# Patient Record
Sex: Female | Born: 1965 | Race: Asian | Hispanic: No | Marital: Married | Smoking: Never smoker
Health system: Southern US, Community
[De-identification: ages and names within clinical notes are randomized; demographics above are authoritative.]

## PROBLEM LIST (undated history)

## (undated) DIAGNOSIS — C50919 Malignant neoplasm of unspecified site of unspecified female breast: Secondary | ICD-10-CM

## (undated) DIAGNOSIS — I341 Nonrheumatic mitral (valve) prolapse: Secondary | ICD-10-CM

## (undated) DIAGNOSIS — O99810 Abnormal glucose complicating pregnancy: Secondary | ICD-10-CM

## (undated) DIAGNOSIS — T783XXA Angioneurotic edema, initial encounter: Secondary | ICD-10-CM

## (undated) DIAGNOSIS — R002 Palpitations: Secondary | ICD-10-CM

## (undated) DIAGNOSIS — T7840XA Allergy, unspecified, initial encounter: Secondary | ICD-10-CM

## (undated) DIAGNOSIS — E079 Disorder of thyroid, unspecified: Secondary | ICD-10-CM

## (undated) DIAGNOSIS — M419 Scoliosis, unspecified: Secondary | ICD-10-CM

## (undated) DIAGNOSIS — O24419 Gestational diabetes mellitus in pregnancy, unspecified control: Secondary | ICD-10-CM

## (undated) HISTORY — DX: Angioneurotic edema, initial encounter: T78.3XXA

## (undated) HISTORY — DX: Disorder of thyroid, unspecified: E07.9

## (undated) HISTORY — DX: Scoliosis, unspecified: M41.9

## (undated) HISTORY — DX: Abnormal glucose complicating pregnancy: O99.810

## (undated) HISTORY — DX: Palpitations: R00.2

## (undated) HISTORY — PX: BREAST LUMPECTOMY: SHX2

## (undated) HISTORY — DX: Malignant neoplasm of unspecified site of unspecified female breast: C50.919

## (undated) HISTORY — PX: TOTAL ABDOMINAL HYSTERECTOMY W/ BILATERAL SALPINGOOPHORECTOMY: SHX83

## (undated) HISTORY — DX: Allergy, unspecified, initial encounter: T78.40XA

## (undated) HISTORY — DX: Nonrheumatic mitral (valve) prolapse: I34.1

## (undated) HISTORY — PX: APPENDECTOMY: SHX54

## (undated) HISTORY — DX: Gestational diabetes mellitus in pregnancy, unspecified control: O24.419

---

## 2003-04-09 DIAGNOSIS — C50919 Malignant neoplasm of unspecified site of unspecified female breast: Secondary | ICD-10-CM

## 2003-04-09 HISTORY — DX: Malignant neoplasm of unspecified site of unspecified female breast: C50.919

## 2006-07-28 ENCOUNTER — Ambulatory Visit: Payer: Self-pay | Admitting: Internal Medicine

## 2006-08-05 ENCOUNTER — Ambulatory Visit: Payer: Self-pay | Admitting: Internal Medicine

## 2006-08-05 LAB — CONVERTED CEMR LAB
ALT: 14 units/L (ref 0–40)
Alkaline Phosphatase: 52 units/L (ref 39–117)
BUN: 6 mg/dL (ref 6–23)
Basophils Relative: 0.5 % (ref 0.0–1.0)
Bilirubin, Direct: 0.1 mg/dL (ref 0.0–0.3)
CO2: 28 meq/L (ref 19–32)
Calcium: 8.8 mg/dL (ref 8.4–10.5)
Cholesterol: 222 mg/dL (ref 0–200)
Creatinine, Ser: 0.6 mg/dL (ref 0.4–1.2)
Eosinophils Relative: 1.3 % (ref 0.0–5.0)
Glucose, Bld: 97 mg/dL (ref 70–99)
Hemoglobin: 11.5 g/dL — ABNORMAL LOW (ref 12.0–15.0)
Lymphocytes Relative: 27.1 % (ref 12.0–46.0)
Monocytes Absolute: 0.3 10*3/uL (ref 0.2–0.7)
Monocytes Relative: 6.6 % (ref 3.0–11.0)
Platelets: 294 10*3/uL (ref 150–400)
RDW: 13.8 % (ref 11.5–14.6)
Total Bilirubin: 0.7 mg/dL (ref 0.3–1.2)
Total CHOL/HDL Ratio: 4.2
Total Protein: 7.8 g/dL (ref 6.0–8.3)
VLDL: 19 mg/dL (ref 0–40)
WBC: 4.8 10*3/uL (ref 4.5–10.5)

## 2006-09-19 ENCOUNTER — Encounter: Payer: Self-pay | Admitting: Internal Medicine

## 2006-10-08 ENCOUNTER — Ambulatory Visit: Payer: Self-pay | Admitting: Internal Medicine

## 2006-11-03 ENCOUNTER — Encounter: Payer: Self-pay | Admitting: Internal Medicine

## 2006-11-03 ENCOUNTER — Other Ambulatory Visit: Admission: RE | Admit: 2006-11-03 | Discharge: 2006-11-03 | Payer: Self-pay | Admitting: Obstetrics and Gynecology

## 2006-11-12 ENCOUNTER — Encounter: Admission: RE | Admit: 2006-11-12 | Discharge: 2006-11-12 | Payer: Self-pay | Admitting: Obstetrics and Gynecology

## 2006-12-04 DIAGNOSIS — Z853 Personal history of malignant neoplasm of breast: Secondary | ICD-10-CM | POA: Insufficient documentation

## 2007-01-23 ENCOUNTER — Encounter: Payer: Self-pay | Admitting: Internal Medicine

## 2007-02-09 ENCOUNTER — Ambulatory Visit: Payer: Self-pay | Admitting: Internal Medicine

## 2007-02-09 DIAGNOSIS — Z8679 Personal history of other diseases of the circulatory system: Secondary | ICD-10-CM | POA: Insufficient documentation

## 2007-02-09 DIAGNOSIS — O99810 Abnormal glucose complicating pregnancy: Secondary | ICD-10-CM

## 2007-02-09 DIAGNOSIS — R7301 Impaired fasting glucose: Secondary | ICD-10-CM | POA: Insufficient documentation

## 2007-02-09 DIAGNOSIS — J029 Acute pharyngitis, unspecified: Secondary | ICD-10-CM | POA: Insufficient documentation

## 2007-02-09 DIAGNOSIS — D649 Anemia, unspecified: Secondary | ICD-10-CM | POA: Insufficient documentation

## 2007-02-09 HISTORY — DX: Abnormal glucose complicating pregnancy: O99.810

## 2007-02-11 ENCOUNTER — Encounter: Payer: Self-pay | Admitting: Internal Medicine

## 2007-05-01 ENCOUNTER — Ambulatory Visit: Payer: Self-pay | Admitting: Internal Medicine

## 2007-05-01 LAB — CONVERTED CEMR LAB
Bilirubin Urine: NEGATIVE
Ketones, urine, test strip: NEGATIVE
Protein, U semiquant: NEGATIVE
Urobilinogen, UA: 0.2
Vit D, 1,25-Dihydroxy: 56 (ref 30–89)

## 2007-05-03 LAB — CONVERTED CEMR LAB
ALT: 17 units/L (ref 0–35)
Alkaline Phosphatase: 43 units/L (ref 39–117)
BUN: 10 mg/dL (ref 6–23)
Basophils Relative: 0.3 % (ref 0.0–1.0)
CO2: 30 meq/L (ref 19–32)
Calcium: 9.6 mg/dL (ref 8.4–10.5)
Chloride: 104 meq/L (ref 96–112)
Cholesterol: 217 mg/dL (ref 0–200)
GFR calc Af Amer: 118 mL/min
HDL: 49.2 mg/dL (ref >39.0)
Lymphocytes Relative: 32.4 % (ref 12.0–46.0)
Monocytes Relative: 5.5 % (ref 3.0–11.0)
Neutro Abs: 2.6 10*3/uL (ref 1.4–7.7)
Platelets: 266 10*3/uL (ref 150–400)
RBC: 4.07 M/uL (ref 3.87–5.11)
RDW: 12 % (ref 11.5–14.6)
TSH: 1.04 microintl units/mL (ref 0.35–5.50)
Total CHOL/HDL Ratio: 4.4
Total Protein: 7.6 g/dL (ref 6.0–8.3)
Triglycerides: 100 mg/dL (ref 0–149)
VLDL: 20 mg/dL (ref 0–40)

## 2007-05-08 ENCOUNTER — Ambulatory Visit: Payer: Self-pay | Admitting: Internal Medicine

## 2007-05-08 LAB — CONVERTED CEMR LAB
Bilirubin Urine: NEGATIVE
Ketones, urine, test strip: NEGATIVE
Specific Gravity, Urine: 1.02

## 2007-05-15 ENCOUNTER — Encounter: Admission: RE | Admit: 2007-05-15 | Discharge: 2007-05-15 | Payer: Self-pay | Admitting: Endocrinology

## 2007-05-20 ENCOUNTER — Ambulatory Visit: Payer: Self-pay | Admitting: Internal Medicine

## 2007-06-17 ENCOUNTER — Ambulatory Visit: Payer: Self-pay | Admitting: Internal Medicine

## 2007-09-22 ENCOUNTER — Encounter: Payer: Self-pay | Admitting: Internal Medicine

## 2007-10-10 LAB — CONVERTED CEMR LAB: Pap Smear: NORMAL

## 2007-11-03 ENCOUNTER — Other Ambulatory Visit: Admission: RE | Admit: 2007-11-03 | Discharge: 2007-11-03 | Payer: Self-pay | Admitting: Obstetrics and Gynecology

## 2007-11-17 ENCOUNTER — Ambulatory Visit: Payer: Self-pay | Admitting: Internal Medicine

## 2008-08-29 ENCOUNTER — Ambulatory Visit: Payer: Self-pay | Admitting: Internal Medicine

## 2008-08-29 LAB — CONVERTED CEMR LAB
BUN: 10 mg/dL (ref 6–23)
Basophils Relative: 0.3 % (ref 0.0–3.0)
Bilirubin Urine: NEGATIVE
Bilirubin, Direct: 0 mg/dL (ref 0.0–0.3)
CO2: 27 meq/L (ref 19–32)
Chloride: 108 meq/L (ref 96–112)
Cholesterol: 205 mg/dL — ABNORMAL HIGH (ref 0–200)
Creatinine, Ser: 0.6 mg/dL (ref 0.4–1.2)
Direct LDL: 137.5 mg/dL
Eosinophils Absolute: 0.2 10*3/uL (ref 0.0–0.7)
Glucose, Urine, Semiquant: NEGATIVE
Lymphs Abs: 1.2 10*3/uL (ref 0.7–4.0)
MCHC: 34.7 g/dL (ref 30.0–36.0)
MCV: 94.5 fL (ref 78.0–100.0)
Monocytes Absolute: 0.3 10*3/uL (ref 0.1–1.0)
Neutrophils Relative %: 66.5 % (ref 43.0–77.0)
Platelets: 249 10*3/uL (ref 150.0–400.0)
RBC: 3.72 M/uL — ABNORMAL LOW (ref 3.87–5.11)
TSH: 0.56 microintl units/mL (ref 0.35–5.50)
Total Protein: 7.9 g/dL (ref 6.0–8.3)
pH: 7

## 2008-09-06 ENCOUNTER — Ambulatory Visit: Payer: Self-pay | Admitting: Internal Medicine

## 2008-11-17 ENCOUNTER — Other Ambulatory Visit: Admission: RE | Admit: 2008-11-17 | Discharge: 2008-11-17 | Payer: Self-pay | Admitting: Obstetrics and Gynecology

## 2008-12-30 ENCOUNTER — Ambulatory Visit: Payer: Self-pay | Admitting: Internal Medicine

## 2009-01-05 LAB — CONVERTED CEMR LAB
Basophils Absolute: 0 10*3/uL (ref 0.0–0.1)
Eosinophils Absolute: 0.3 10*3/uL (ref 0.0–0.7)
Ferritin: 12.4 ng/mL (ref 10.0–291.0)
Free T4: 1 ng/dL (ref 0.6–1.6)
Hemoglobin: 12.5 g/dL (ref 12.0–15.0)
Iron: 44 ug/dL (ref 42–145)
Lymphocytes Relative: 27.6 % (ref 12.0–46.0)
Monocytes Relative: 7.7 % (ref 3.0–12.0)
Neutro Abs: 3.4 10*3/uL (ref 1.4–7.7)
Neutrophils Relative %: 58.9 % (ref 43.0–77.0)
Platelets: 303 10*3/uL (ref 150.0–400.0)
RDW: 13.5 % (ref 11.5–14.6)
TSH: 0.71 microintl units/mL (ref 0.35–5.50)

## 2009-01-09 ENCOUNTER — Ambulatory Visit: Payer: Self-pay | Admitting: Internal Medicine

## 2009-01-09 DIAGNOSIS — D509 Iron deficiency anemia, unspecified: Secondary | ICD-10-CM | POA: Insufficient documentation

## 2009-10-17 ENCOUNTER — Encounter: Payer: Self-pay | Admitting: Internal Medicine

## 2010-01-02 ENCOUNTER — Ambulatory Visit: Payer: Self-pay | Admitting: Internal Medicine

## 2010-01-02 LAB — CONVERTED CEMR LAB
ALT: 18 units/L (ref 0–35)
AST: 22 units/L (ref 0–37)
BUN: 15 mg/dL (ref 6–23)
Basophils Relative: 0.4 % (ref 0.0–3.0)
Bilirubin, Direct: 0.1 mg/dL (ref 0.0–0.3)
Chloride: 105 meq/L (ref 96–112)
Creatinine, Ser: 0.6 mg/dL (ref 0.4–1.2)
Direct LDL: 170.1 mg/dL
Eosinophils Relative: 2.8 % (ref 0.0–5.0)
GFR calc non Af Amer: 106.74 mL/min (ref >60)
HCT: 37.8 % (ref 36.0–46.0)
HDL: 56.6 mg/dL (ref >39.00)
MCV: 95 fL (ref 78.0–100.0)
Monocytes Absolute: 0.4 10*3/uL (ref 0.1–1.0)
Monocytes Relative: 8 % (ref 3.0–12.0)
Neutrophils Relative %: 66.7 % (ref 43.0–77.0)
Platelets: 258 10*3/uL (ref 150.0–400.0)
Potassium: 3.9 meq/L (ref 3.5–5.1)
Protein, U semiquant: NEGATIVE
RBC: 3.98 M/uL (ref 3.87–5.11)
Total Bilirubin: 0.2 mg/dL — ABNORMAL LOW (ref 0.3–1.2)
Total CHOL/HDL Ratio: 4
Total Protein: 8 g/dL (ref 6.0–8.3)
Urobilinogen, UA: 0.2
VLDL: 26.2 mg/dL (ref 0.0–40.0)
WBC Urine, dipstick: NEGATIVE
WBC: 5.2 10*3/uL (ref 4.5–10.5)

## 2010-01-09 ENCOUNTER — Ambulatory Visit: Payer: Self-pay | Admitting: Internal Medicine

## 2010-01-09 DIAGNOSIS — M4120 Other idiopathic scoliosis, site unspecified: Secondary | ICD-10-CM | POA: Insufficient documentation

## 2010-01-09 DIAGNOSIS — E785 Hyperlipidemia, unspecified: Secondary | ICD-10-CM | POA: Insufficient documentation

## 2010-05-08 NOTE — Letter (Signed)
Summary: [facility]  [facility]   Imported By: Maryln Gottron 11/10/2009 11:20:33  _____________________________________________________________________  External Attachment:    Type:   Image     Comment:   External Document

## 2010-05-08 NOTE — Assessment & Plan Note (Signed)
Summary: cpx---pap//ccm   Vital Signs:  Patient profile:   45 year old female Menstrual status:  regular LMP:     12/17/2009 Height:      60.25 inches Weight:      121 pounds BMI:     23.52 Pulse rate:   72 / minute BP sitting:   100 / 60  (right arm) Cuff size:   regular  Vitals Entered By: Romualdo Bolk, CMA (AAMA) (January 09, 2010 10:10 AM) CC: CPX without pap. Pt has a gyn who does paps. LMP (date): 12/17/2009 LMP - Character: normal Menarche (age onset years): 10   Menses interval (days): 26-28 Menstrual flow (days): 5-7 Enter LMP: 12/17/2009 Last PAP Result normal   History of Present Illness: Melissa Brown comes in today  for preventive visit . Since last visit  here  there have been no major changes in health status  . Seeing  new gyne .  this week  CV:  taking  atenolol once daily  .tachy . sees Dr Mayford Knife. taking iron with menses   Preventive Care Screening  Mammogram:    Date:  10/22/2009    Results:  normal   Pap Smear:    Date:  10/06/2008    Results:  normal   Prior Values:    Pap Smear:  normal (10/10/2007)    Mammogram:  normal (09/07/2007)    Last Tetanus Booster:  Tdap (07/08/2006)   Preventive Screening-Counseling & Management  Alcohol-Tobacco     Alcohol drinks/day: 0     Smoking Status: never  Caffeine-Diet-Exercise     Caffeine use/day: 0     Does Patient Exercise: yes     Type of exercise: aerobics  Hep-HIV-STD-Contraception     Dental Visit-last 6 months no     Sun Exposure-Excessive: no  Safety-Violence-Falls     Seat Belt Use: yes     Firearms in the Home: no firearms in the home     Smoke Detectors: yes      Blood Transfusions:  no.    Current Medications (verified): 1)  Atenolol 25 Mg  Tabs (Atenolol) .... Take 1 Tablet By Mouth Once A Day 2)  One-A-Day Womens Formula   Tabs (Multiple Vitamins-Calcium) 3)  Lidex 0.05 %  Crea (Fluocinonide) .... As Needed 4)  Ferrous Sulfate 325 (65 Fe) Mg Tabs (Ferrous Sulfate)  .... Takes During Cycle.  Allergies (verified): No Known Drug Allergies  Past History:  Past medical, surgical, family and social histories (including risk factors) reviewed, and no changes noted (except as noted below).  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 breast cancer, radiation DCIS--reconstruction left Caesarean section  Past History:  Care Management: Gynecology: Thomasena Edis Hematology/Oncology: Melins Opthalmologist  Cardiology: Mayford Knife    Dr Talmage Nap in the past   Family History: Reviewed history from 09/06/2008 and no changes required. Family History Diabetes 1st degree relative  mother, father CAD stent father Family History Thyroid disease mom  Social History: Reviewed history from 09/06/2008 and no changes required. 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  to 9  To do online degree accounting.    Blood Transfusions:  no  Review of Systems       neg cv pulm eye  change ( glasses)  ortho  bleeding  GI  has bump stype left eye   Physical Exam  General:  Well-developed,well-nourished,in no acute distress; alert,appropriate and cooperative throughout examination Eyes:  PERRL, EOMs full, conjunctiva clear  left lower lid with pinpoint stye no redness or dc  Ears:  R ear normal, L ear normal, and no external deformities.   Nose:  no external deformity, no external erythema, and no nasal discharge.   Mouth:  pharynx pink and moist.   Neck:  palpable thyroid no nodules  Chest Wall:  left breast reconstr no masses  Breasts:  left breast reconstructed  no masses  Lungs:  Normal respiratory effort, chest expands symmetrically. Lungs are clear to auscultation, no crackles or wheezes. Heart:  Normal rate and  regular rhythm. S1 and S2 normal without gallop, murmur, click, rub or other extra sounds.no lifts.   Abdomen:  Bowel sounds positive,abdomen soft and non-tender without masses, organomegaly or hernias noted. Msk:  no joint swelling and no joint warmth.  scoliosis left hump.  Pulses:  pulses intact without delay   Extremities:  no clubbing cyanosis or edema  Neurologic:  alert & oriented X3, cranial nerves II-XII intact, sensation intact to light touch, gait normal, and DTRs symmetrical and normal.   Skin:  turgor normal, color normal, no ecchymoses, no petechiae, and no purpura.   Cervical Nodes:  No lymphadenopathy noted Axillary Nodes:  No palpable lymphadenopathy Inguinal Nodes:  No significant adenopathy Psych:  Normal eye contact, appropriate affect. Cognition appears normal.    Impression & Recommendations:  Problem # 1:  Preventive Health Care (ICD-V70.0) Discussed nutrition,exercise,diet,healthy weight, vitamin D and calcium.   Problem # 2:  IRON DEFICIENCY (ICD-280.9) no longer anemia       iron studies not done by lab as requested  Her updated medication list for this problem includes:    Ferrous Sulfate 325 (65 Fe) Mg Tabs (Ferrous sulfate) .Marland Kitchen... Takes during cycle.  Problem # 3:  HYPERLIPIDEMIA (ICD-272.4) Assessment: Deteriorated  reviewed and counseled   Labs Reviewed: SGOT: 22 (01/02/2010)   SGPT: 18 (01/02/2010)   HDL:56.60 (01/02/2010), 53.40 (08/29/2008)  LDL:DEL (05/01/2007), DEL (08/05/2006)  Chol:252 (01/02/2010), 205 (08/29/2008)  Trig:131.0 (01/02/2010), 73.0 (08/29/2008)  Problem # 4:  BREAST CANCER, HX OF (ICD-V10.3) vit d level not done   as requested   disc doing at  next blood draw.   Complete Medication List: 1)  Atenolol 25 Mg Tabs (Atenolol) .... Take 1 tablet by mouth once a day 2)  One-a-day Womens Formula Tabs (Multiple vitamins-calcium) 3)  Lidex 0.05 % Crea (Fluocinonide) .... As needed 4)  Ferrous Sulfate 325 (65 Fe) Mg Tabs (Ferrous  sulfate) .... Takes during cycle.  Patient Instructions: 1)  rec take [phone] International Units of vitamin D  per day 2)  (Check your vitamin bottle) 3)  Ok to continue  iron  during period times . 4)  Increase exercise   reviewed diet  because of your increased cholesterol. 5)  Next blood check  yearly  check up  add vitamin D level .

## 2010-08-24 NOTE — Assessment & Plan Note (Signed)
[facility]                            [facility] OFFICE NOTE   NAME:Melissa Brown, Melissa Brown                          MRN:          [id number]  DATE:07/28/2006                            DOB:          Sep 12, 1965    NEW PATIENT EVALUATION:   CHIEF COMPLAINT:  New patient to get established.   HISTORY OF PRESENT ILLNESS:  Ms. Meuser is a 45 year old nonsmoking  Chinese-American married female who comes in today for first-time visit.  She is establishing in the area.  Needs a primary care physician.  She  carries diagnoses of past history of breast cancer, right DCIS, status  post mastectomy and reconstruction in 2005.  Has chosen not to take  tamoxifen suppression.  She also has a diagnosis of mitral valve  prolapse for which she is on atenolol for tachycardia, is seen by Dr.  Armanda Magic, cardiologist.  Her oncologist locally is Dr. Lennie Odor.   Also of concern is her past history of gestational diabetes and slightly  elevated fasting blood sugar with a very strong family history of type 2  diabetes.  Her last blood work was done about a year ago, and she is due  for this also.   PAST MEDICAL HISTORY:  See database.  She is gravida 2, para 1.  Last  Pap July, 2006.  Last mammogram January, 2008.   SURGERIES:  A C-section in 1977.  Breast biopsy in 2005 and 2006.  Mastectomy, DCIS in 2005 with breast reconstruction.   Echocardiogram two years ago.  History of abnormal thyroid in the past.  She has had some borderline elevated lipids with generally apparently a  normal ratio.   MEDICATIONS:  Atenolol 25 mg a day.   DRUG ALLERGIES:  None recorded.   FAMILY HISTORY:  Father with coronary artery disease.  He had a stent.  Both parents have type 2 diabetes, ages 27 and 46, mom with thyroid  disease.   SOCIAL HISTORY:  Household of three.  Is an Airline pilot.  Negative TAD.  Tries to do regular exercise 3-4 times a week, aerobics, about a half  hour.   REVIEW OF SYSTEMS:  Negative for chest pain, shortness of breath.  Significant GI/GU problems except as per HPI.   OBJECTIVE:  VITAL SIGNS:  Height 5 feet.  Weight 112.  Pulse 78 and  regular.  Blood pressure 110/70.  GENERAL:  This is a WD/WN, healthy-appearing lady in no acute distress.  HEENT:  Grossly normal.  NECK:  Supple without mass, thyromegaly, or bruit.  CHEST:  CTA.  BS equal.  CARDIAC:  S1 and S2.  No gallops.  A 1/6 systolic ejection murmur.  Diastole is clear.  Peripheral pulses are present without delay.  BREASTS:  Left breast with well-healed scar.  ABDOMEN:  Soft without organomegaly, guarding or rebound.  Graft site  noted.  EXTREMITIES:  No significant edema.  NEUROLOGIC:  Grossly intact.   Laboratory studies that she brings in from a year ago are remarkable  only for a fasting blood sugar of 100.   IMPRESSION:  1.  Elevated fasting blood sugar with family history of type 2 diabetes      and personal history of gestational diabetes at risk but has      recently lifestyle and good Body Mass Index.  2. 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  3. History of elevated lipids but adequate ratio.  4. History of mitral valve prolapse, on no medications for      palpitations or tachycardia.   PLAN:  DTaP given today.  Stool cards given.  Names of local  gynecologist given to establish care.  Reviewed her diet and exercise.  Check for labs today.  Plan RV in about six months for followup in this  area.     Neta Mends. Panosh, MD  Electronically Signed    WKP/MedQ  DD: 07/29/2006  DT: 07/30/2006  Job #: [id number]

## 2010-12-07 ENCOUNTER — Encounter: Payer: Self-pay | Admitting: Internal Medicine

## 2010-12-07 ENCOUNTER — Ambulatory Visit (INDEPENDENT_AMBULATORY_CARE_PROVIDER_SITE_OTHER): Payer: BC Managed Care – PPO | Admitting: Internal Medicine

## 2010-12-07 VITALS — BP 120/70 | HR 66 | Wt 122.0 lb

## 2010-12-07 DIAGNOSIS — T783XXA Angioneurotic edema, initial encounter: Secondary | ICD-10-CM | POA: Insufficient documentation

## 2010-12-07 DIAGNOSIS — Z91010 Allergy to peanuts: Secondary | ICD-10-CM | POA: Insufficient documentation

## 2010-12-07 DIAGNOSIS — L509 Urticaria, unspecified: Secondary | ICD-10-CM

## 2010-12-07 DIAGNOSIS — T7840XA Allergy, unspecified, initial encounter: Secondary | ICD-10-CM

## 2010-12-07 HISTORY — DX: Angioneurotic edema, initial encounter: T78.3XXA

## 2010-12-07 MED ORDER — PREDNISONE 20 MG PO TABS: ORAL_TABLET | ORAL | Status: AC

## 2010-12-07 NOTE — Patient Instructions (Addendum)
This is hives with some angioedema sx  ( swelling  Of the lips)   Do not  Take any kind of nuts in case this is aggravator.   Take prednisone  And  antihistamine  Such as zyrtec each day until gone. Seek emergent care if you get throat swelling,shortness of breath. If this recurs or doesn't resolve with above treatment we should refer to allergist.

## 2010-12-07 NOTE — Progress Notes (Signed)
  Subjective:    Patient ID: Melissa Brown, female    DOB: October 25, 1965, 45 y.o.   MRN: [id number]  HPI Comes in today for an acute visit.  Onset: off and on weeks 2 . Started on skin buttock itchy rash Like hives  When younger .   Comes and goes  Better with Benadryl that she took yesterday  Worse with nothing on  Intensity:itchy and not painful bad now  Pattern  And context:waxing and waning  Treatment tried   Aloe gel.   Benadryl when lips /mouth swelled.   No coughing shortness of breath wheezing swelling of her throat inside although she states she does get itchy throat.  No strep exposure UTI symptoms excessive strawberry use recently nut intake. No change in diet. No one is sick at home   Review of Systems ROS:  GEN/ HEENTNo fever, significant weight changes sweats headaches vision problems hearing changes, CV/ PULM; No chest pain shortness of breath cough, syncope,edema  change in exercise tolerance. GI /GU: No adominal pain, vomiting, change in bowel habits. No blood in the stool. No significant GU symptoms. SKIN/HEME: ,no bleeding. No lymphadenopathy, nodules, masses.  NEURO/ PSYCH:  No neurologic signs such as weakness numbness No depression anxiety. IMM/ Allergy: No unusual infections.  Allergy .  No sneezing nausea vomiting diarrhea. REST of 12 system review negative  Past history family history social history reviewed in the electronic medical record.     Objective:   Physical Exam Physical Exam: Vital signs reviewed looks well although has an occasional cough. JXB:JYNW is a well-developed well-nourished alert cooperative  [place]  female who appears her stated age in no acute distress.  HEENT: normocephalic  traumatic , Eyes: PERRL EOM's full, conjunctiva clear, Nares: paten,t no deformity discharge or tenderness., Ears: no deformity EAC's clear TMs with normal landmarks. Mouth: clear OP, no lesions, edema.  Moist mucous membranes.  NECK: supple without  masses,  bruits. CHEST/PULM:  Clear to auscultation and percussion breath sounds equal no wheeze , rales or rhonchi. No chest wall deformities or tenderness. CV: PMI is nondisplaced, S1 S2 no gallops, murmurs, rubs. Peripheral pulses are full without delay.No JVD .  ABDOMEN: Bowel sounds normal nontender  No guard or rebound, no hepato splenomegal no CVA tenderness.  No hernia. Extremtities:  No clubbing cyanosis or edema, no acute joint swelling or redness no focal atrophy NEURO:  Oriented x3, cranial nerves 3-12 appear to be intact, no obvious focal weakness,gait within normal limitsl SKIN:  normal turgor, color, no bruising or petechiae. Review small hive-like lesions couple on the back one on the right wrist otherwise normal no angioedema PSYCH: Oriented, good eye contact, no obvious depression anxiety, cognition and judgment appear normal.    Assessment & Plan:  Hives   remote history of same He was to respond to the antihistamine however had what sounds like an episode of lip angioedema.  Note history of skin testing positive to peanuts. She states she's been able to eat cashews in the past without difficulty. No recent not exposure. No recent triggers as above. She wants to wait on allergy referral unless not better .  Reviewed extensively treatment take prednisone now with taper antihistamine until the hives are a lot better; seek urgent care if any respiratory symptoms. She will get back with [place] if this is recurrent.  Total visit > 50% spent counseling and coordinating care

## 2011-01-02 ENCOUNTER — Telehealth: Payer: Self-pay | Admitting: *Deleted

## 2011-01-02 DIAGNOSIS — T783XXA Angioneurotic edema, initial encounter: Secondary | ICD-10-CM

## 2011-01-02 DIAGNOSIS — L509 Urticaria, unspecified: Secondary | ICD-10-CM

## 2011-01-02 NOTE — Telephone Encounter (Signed)
Good morning Melissa Brown,  I am a patient of Dr. Fabian Sharp. I saw Dr. Fabian Sharp regarding hives a couple of weeks back. At that time, she prescribed a medicine. She also mentioned that if that would not work, she would refer me to an allergist.  Now my hives & swollen lips come back. I'd like to see an allergist. Following is the allergist I'd prefer to see:  Dr. Thad Ranger (at his [facility] office)  [address]  [place], [place] [address]  Office phone [phone]  I appreciate that you will arrange a referral & make an appointment for me.  Thank you  Regards,  Melissa Brown (DOB 04/25/1965)  Cell [phone]

## 2011-01-02 NOTE — Telephone Encounter (Signed)
Per Dr. Fabian Sharp- Ok to do referral. But call pt to make sure pt is not having any sob or allergic reactions right now. Order sent to [facility].

## 2011-01-02 NOTE — Telephone Encounter (Signed)
Spoke to pt and she is not having breathing or acute problems.

## 2011-01-08 DIAGNOSIS — M8580 Other specified disorders of bone density and structure, unspecified site: Secondary | ICD-10-CM | POA: Insufficient documentation

## 2011-01-08 DIAGNOSIS — R000 Tachycardia, unspecified: Secondary | ICD-10-CM | POA: Insufficient documentation

## 2011-01-08 DIAGNOSIS — D0512 Intraductal carcinoma in situ of left breast: Secondary | ICD-10-CM | POA: Insufficient documentation

## 2011-01-11 ENCOUNTER — Other Ambulatory Visit (INDEPENDENT_AMBULATORY_CARE_PROVIDER_SITE_OTHER): Payer: BC Managed Care – PPO

## 2011-01-11 DIAGNOSIS — Z0000 Encounter for general adult medical examination without abnormal findings: Secondary | ICD-10-CM

## 2011-01-11 LAB — BASIC METABOLIC PANEL
BUN: 13 mg/dL (ref 6–23)
CO2: 26 mEq/L (ref 19–32)
Chloride: 104 mEq/L (ref 96–112)
Glucose, Bld: 105 mg/dL — ABNORMAL HIGH (ref 70–99)
Potassium: 3.8 mEq/L (ref 3.5–5.1)
Sodium: 138 mEq/L (ref 135–145)

## 2011-01-11 LAB — LIPID PANEL
Cholesterol: 206 mg/dL — ABNORMAL HIGH (ref 0–200)
VLDL: 24.8 mg/dL (ref 0.0–40.0)

## 2011-01-11 LAB — CBC WITH DIFFERENTIAL/PLATELET
Basophils Absolute: 0 10*3/uL (ref 0.0–0.1)
HCT: 35.8 % — ABNORMAL LOW (ref 36.0–46.0)
Hemoglobin: 11.9 g/dL — ABNORMAL LOW (ref 12.0–15.0)
Lymphs Abs: 1.2 10*3/uL (ref 0.7–4.0)
MCHC: 33.4 g/dL (ref 30.0–36.0)
MCV: 93.5 fl (ref 78.0–100.0)
Monocytes Absolute: 0.3 10*3/uL (ref 0.1–1.0)
Monocytes Relative: 6.5 % (ref 3.0–12.0)
Neutro Abs: 3.3 10*3/uL (ref 1.4–7.7)
Platelets: 294 10*3/uL (ref 150.0–400.0)
RDW: 12.9 % (ref 11.5–14.6)

## 2011-01-11 LAB — HEPATIC FUNCTION PANEL
AST: 15 U/L (ref 0–37)
Alkaline Phosphatase: 56 U/L (ref 39–117)
Bilirubin, Direct: 0 mg/dL (ref 0.0–0.3)
Total Protein: 7.5 g/dL (ref 6.0–8.3)

## 2011-01-11 LAB — LDL CHOLESTEROL, DIRECT: Direct LDL: 140.5 mg/dL

## 2011-01-18 ENCOUNTER — Ambulatory Visit (INDEPENDENT_AMBULATORY_CARE_PROVIDER_SITE_OTHER): Payer: BC Managed Care – PPO | Admitting: Internal Medicine

## 2011-01-18 ENCOUNTER — Encounter: Payer: Self-pay | Admitting: Internal Medicine

## 2011-01-18 ENCOUNTER — Encounter: Payer: Self-pay | Admitting: *Deleted

## 2011-01-18 VITALS — BP 120/80 | HR 72 | Ht 60.25 in | Wt 121.0 lb

## 2011-01-18 DIAGNOSIS — R7309 Other abnormal glucose: Secondary | ICD-10-CM

## 2011-01-18 DIAGNOSIS — T783XXA Angioneurotic edema, initial encounter: Secondary | ICD-10-CM

## 2011-01-18 DIAGNOSIS — E785 Hyperlipidemia, unspecified: Secondary | ICD-10-CM

## 2011-01-18 DIAGNOSIS — Z853 Personal history of malignant neoplasm of breast: Secondary | ICD-10-CM

## 2011-01-18 DIAGNOSIS — L509 Urticaria, unspecified: Secondary | ICD-10-CM

## 2011-01-18 DIAGNOSIS — Z0000 Encounter for general adult medical examination without abnormal findings: Secondary | ICD-10-CM | POA: Insufficient documentation

## 2011-01-18 DIAGNOSIS — D649 Anemia, unspecified: Secondary | ICD-10-CM

## 2011-01-18 DIAGNOSIS — M4120 Other idiopathic scoliosis, site unspecified: Secondary | ICD-10-CM

## 2011-01-18 LAB — POCT URINALYSIS DIPSTICK
Bilirubin, UA: NEGATIVE
Glucose, UA: NEGATIVE
Ketones, UA: NEGATIVE
Leukocytes, UA: NEGATIVE
Nitrite, UA: NEGATIVE

## 2011-01-18 NOTE — Assessment & Plan Note (Signed)
Borderline today felt secondary to menses.  Disc use of iron and iron store repletion  . Check cbc yearly or if increase fatigue or bleeding. Share labs with her GYNE.

## 2011-01-18 NOTE — Assessment & Plan Note (Signed)
follow

## 2011-01-18 NOTE — Progress Notes (Signed)
Subjective:    Patient ID: Melissa Brown, female    DOB: [birth year], 45 y.o.   MRN: [id number]  HPI Patient comes in today for Preventive Health Care visit  Since last visit has done well. Hives come and go ,.taking zyrtec prn.  Still recur without resp sx. Tries to keep healthy life style.  klittle exercise in her job . No physical limitations   Review of Systems ROS:  GEN/ HEENTNo fever, significant weight changes sweats headaches vision problems  Wears glasseshearing changes, CV/ PULM; No chest pain shortness of breath cough, syncope,edema  change in exercise tolerance. GI /GU: No adominal pain, vomiting, change in bowel habits. No blood in the stool. No significant GU symptoms. SKIN/HEME: ,no acute skin rashes suspicious lesions or bleeding. No lymphadenopathy, nodules, masses.  Periods regular last 5 days NEURO/ PSYCH:  No neurologic signs such as weakness numbness No depression anxiety. IMM/ Allergy: No unusual infections.  Allergy .  As above gets hand eczema REST of 12 system review negative  Past Medical History  Diagnosis Date  . Hypertension   . Breast cancer 2005    left dcis  . Diabetes, gestational   . Scoliosis    Past Surgical History  Procedure Date  . Breast lumpectomy     radiation DCIS- reconstruction left  . Cesarean section     reports that she has never smoked. She does not have any smokeless tobacco history on file. She reports that she drinks about .5 ounces of alcohol per week. She reports that she does not use illicit drugs. family history includes Diabetes in her father, mother, and other; Heart disease in her father; and Thyroid disease in her mother. No Known Allergies      Objective:   Physical Exam Physical Exam: Vital signs reviewed XBM:WUXL is a well-developed well-nourished alert cooperative  Asian A m female who appears her stated age in no acute distress.  HEENT: normocephalic  traumatic , Eyes: PERRL EOM's full, conjunctiva clear,  Nares: paten,t no deformity discharge or tenderness., Ears: no deformity EAC's clear TMs with normal landmarks. Mouth: clear OP, no lesions, edema.  Moist mucous membranes. Dentition in adequate repair. NECK: supple without masses, thyromegaly or bruits. CHEST/PULM:  Clear to auscultation and percussion breath sounds equal no wheeze , rales or rhonchi. No chest wall deformities or tenderness. CV: PMI is nondisplaced, S1 S2 no gallops, murmurs, rubs. Peripheral pulses are full without delay.No JVD .  Breast: left breast reconstructed no nodules right nl by inspection and palpation axilla clear ABDOMEN: Bowel sounds normal nontender  No guard or rebound, no hepato splenomegal no CVA tenderness.  No hernia.  PELVIC per gyne Extremtities:  No clubbing cyanosis or edema, no acute joint swelling or redness no focal atrophy  scolisosis noted nl gait NEURO:  Oriented x3, cranial nerves 3-12 appear to be intact, no obvious focal weakness,gait within normal limits no abnormal reflexes or asymmetrical SKIN: No acute rashes normal turgor, color, no bruising or petechiae. PSYCH: Oriented, good eye contact, no obvious depression anxiety, cognition and judgment appear normal. LN: no cervical axillary inguinal adenopathy   Lab Results  Component Value Date   WBC 5.0 01/11/2011   HGB 11.9* 01/11/2011   HCT 35.8* 01/11/2011   PLT 294.0 01/11/2011   GLUCOSE 105* 01/11/2011   CHOL 206* 01/11/2011   TRIG 124.0 01/11/2011   HDL 48.40 01/11/2011   LDLDIRECT 140.5 01/11/2011   ALT 13 01/11/2011   AST 15 01/11/2011   NA 138 01/11/2011  K 3.8 01/11/2011   CL 104 01/11/2011   CREATININE 0.6 01/11/2011   BUN 13 01/11/2011   CO2 26 01/11/2011   TSH 0.90 01/11/2011   HGBA1C 5.5 05/01/2007        Assessment & Plan:  Preventive Health Care Counseled regarding healthy nutrition, exercise, sleep, injury prevention, calcium vit d and healthy weight . Recurrent hives  ? Cause   Taking zyrtec helps.  To see allergist.  LIPIDS:   Better  Intensify lsi  Fasting hyperglycemia with hx of gest dm.  LSI  Continue monitoring Anemia borderline  prob iron from menses Breast cancer hx stable

## 2011-01-18 NOTE — Patient Instructions (Signed)
Take multivitamin with iron to help wth the borderline anemia.  Try taking zyrtec every day for hive suppression for about 2 weeks ( except for allergy assessment)    Encourage  Exercise if possible t help with the lipids although your LDL is better . Try to get this below 130  With healthy diet and exercise.

## 2011-01-18 NOTE — Assessment & Plan Note (Signed)
No change  Continue lifestyle intervention healthy eating and exercise .

## 2011-01-18 NOTE — Assessment & Plan Note (Signed)
Hx of recurrent hives since last visit using prn zyrtec which helps has appt with allergist to review.

## 2011-01-21 DIAGNOSIS — R7301 Impaired fasting glucose: Secondary | ICD-10-CM | POA: Insufficient documentation

## 2012-01-17 ENCOUNTER — Other Ambulatory Visit (INDEPENDENT_AMBULATORY_CARE_PROVIDER_SITE_OTHER): Payer: BC Managed Care – PPO

## 2012-01-17 DIAGNOSIS — Z0000 Encounter for general adult medical examination without abnormal findings: Secondary | ICD-10-CM

## 2012-01-17 LAB — POCT URINALYSIS DIPSTICK
Blood, UA: NEGATIVE
Glucose, UA: NEGATIVE
Protein, UA: NEGATIVE
Spec Grav, UA: 1.01
Urobilinogen, UA: 0.2

## 2012-01-17 LAB — LIPID PANEL
Total CHOL/HDL Ratio: 5
Triglycerides: 132 mg/dL (ref 0.0–149.0)

## 2012-01-17 LAB — CBC WITH DIFFERENTIAL/PLATELET
Basophils Relative: 0.7 % (ref 0.0–3.0)
Eosinophils Relative: 3 % (ref 0.0–5.0)
HCT: 38.5 % (ref 36.0–46.0)
Hemoglobin: 12.8 g/dL (ref 12.0–15.0)
Lymphs Abs: 1.2 10*3/uL (ref 0.7–4.0)
Monocytes Relative: 7.5 % (ref 3.0–12.0)
Neutro Abs: 3.5 10*3/uL (ref 1.4–7.7)
WBC: 5.3 10*3/uL (ref 4.5–10.5)

## 2012-01-17 LAB — BASIC METABOLIC PANEL
Chloride: 106 mEq/L (ref 96–112)
GFR: 109.73 mL/min (ref >60.00)
Potassium: 4.1 mEq/L (ref 3.5–5.1)
Sodium: 143 mEq/L (ref 135–145)

## 2012-01-17 LAB — HEPATIC FUNCTION PANEL
ALT: 19 U/L (ref 0–35)
AST: 19 U/L (ref 0–37)
Albumin: 4.1 g/dL (ref 3.5–5.2)
Total Protein: 7.9 g/dL (ref 6.0–8.3)

## 2012-01-17 LAB — LDL CHOLESTEROL, DIRECT: Direct LDL: 163.2 mg/dL

## 2012-01-17 LAB — TSH: TSH: 0.91 u[IU]/mL (ref 0.35–5.50)

## 2012-01-24 ENCOUNTER — Encounter: Payer: Self-pay | Admitting: Internal Medicine

## 2012-01-24 ENCOUNTER — Ambulatory Visit (INDEPENDENT_AMBULATORY_CARE_PROVIDER_SITE_OTHER): Payer: BC Managed Care – PPO | Admitting: Internal Medicine

## 2012-01-24 VITALS — BP 130/82 | HR 92 | Temp 98.2°F | Ht 60.0 in | Wt 121.0 lb

## 2012-01-24 DIAGNOSIS — Z0000 Encounter for general adult medical examination without abnormal findings: Secondary | ICD-10-CM

## 2012-01-24 DIAGNOSIS — R7309 Other abnormal glucose: Secondary | ICD-10-CM

## 2012-01-24 DIAGNOSIS — E785 Hyperlipidemia, unspecified: Secondary | ICD-10-CM

## 2012-01-24 DIAGNOSIS — Z853 Personal history of malignant neoplasm of breast: Secondary | ICD-10-CM

## 2012-01-24 MED ORDER — BETAMETHASONE DIPROPIONATE 0.05 % EX CREA: TOPICAL_CREAM | Freq: Two times a day (BID) | CUTANEOUS | Status: DC

## 2012-01-24 NOTE — Progress Notes (Signed)
Subjective:    Patient ID: Melissa Brown, female    DOB: Jul 02, 1965, 46 y.o.   MRN: [id number]  HPI Patient comes in today for preventive visit and follow-up of medical issues. Update  history since  last visit: No major changes ; ,injury surgery or hospitalizations. Not a lot of exercise  Hard to maintain weight.  Would like rx for cream given by derm betamethasone.  Review of Systems ROS:  GEN/ HEENT: No fever, significant weight changes sweats headaches vision problems hearing changes, CV/ PULM; No chest pain shortness of breath cough, syncope,edema  change in exercise tolerance. GI /GU: No adominal pain, vomiting, change in bowel habits. No blood in the stool. No significant GU symptoms. SKIN/HEME: ,no acute skin rashes suspicious lesions or bleeding. No lymphadenopathy, nodules, masses.  NEURO/ PSYCH:  No neurologic signs such as weakness numbness. No depression anxiety. IMM/ Allergy: No unusual infections.  Allergy .   REST of 12 system review negative except as per HPI Past history family history social history reviewed in the electronic medical record.     Objective:   Physical Exam Physical Exam: Vital signs reviewed JXB:JYNW is a well-developed well-nourished alert cooperative   female who appears her stated age in no acute distress.  HEENT: normocephalic atraumatic , Eyes: PERRL EOM's full, conjunctiva clear, Nares: paten,t no deformity discharge or tenderness., Ears: no deformity EAC's clear TMs with normal landmarks. Mouth: clear OP, no lesions, edema.  Moist mucous membranes. Dentition in adequate repair. NECK: supple without masses, thyromegaly or bruits. CHEST/PULM:  Clear to auscultation and percussion breath sounds equal no wheeze , rales or rhonchi. No chest wall deformities or tenderness. CV: PMI is nondisplaced, S1 S2 no gallops, murmurs, rubs. Peripheral pulses are full without delay.No JVD .  Breast: normal by inspection . Right  No dimpling, discharge, masses,  tenderness or discharge . Left breast reconstructed  ABDOMEN: Bowel sounds normal nontender  No guard or rebound, no hepato splenomegal no CVA tenderness.  No hernia. Extremtities:  No clubbing cyanosis or edema, no acute joint swelling or redness no focal atrophy  Scoliosis  NEURO:  Oriented x3, cranial nerves 3-12 appear to be intact, no obvious focal weakness,gait within normal limits no abnormal reflexes or asymmetrical SKIN: normal turgor, color, no bruising or petechiae. Large thichened pink patch back of head neck  PSYCH: Oriented, good eye contact, no obvious depression anxiety, cognition and judgment appear normal. LN: no cervical axillary inguinal adenopathy    Lab Results  Component Value Date   WBC 5.3 01/17/2012   HGB 12.8 01/17/2012   HCT 38.5 01/17/2012   PLT 284.0 01/17/2012   GLUCOSE 113* 01/17/2012   CHOL 215* 01/17/2012   TRIG 132.0 01/17/2012   HDL 45.10 01/17/2012   LDLDIRECT 163.2 01/17/2012   ALT 19 01/17/2012   AST 19 01/17/2012   NA 143 01/17/2012   K 4.1 01/17/2012   CL 106 01/17/2012   CREATININE 0.6 01/17/2012   BUN 11 01/17/2012   CO2 27 01/17/2012   TSH 0.91 01/17/2012   HGBA1C 5.5 05/01/2007        Assessment & Plan:  .Preventive Health Care Counseled regarding healthy nutrition, exercise, sleep, injury prevention, calcium vit d and healthy weight . utd otherwise  Flu vacc at work  Hx of gest dm ;prediabetic FBS to monitor     LIPIDS  Modestly high ldl  Skin dermatitis  Ok to refill med per derm .  done  Vit d  Borderline low again  Hx of breast cancer   Slightly low vit d   Increase supplement  Doesn't really drink milk  Can do lsi and then standing order for a1c and then lipid and vit d level  Check in 4-5 months and cpx in 1 year or as needed

## 2012-01-24 NOTE — Patient Instructions (Signed)
Intensify lifestyle interventions. You numbers look pre diabetic  Increase Vit d 3 100 iu per day  Will put order in for standing order of Hg a1c vit d and cholesterol panel so you can get these repeated in 4- 6 months ( lipid test are fasting ). Will send in order for steroid cream. Otherwise [facility] wellness visit in 12 months if doing well.   A1c, Hemoglobin A1c The A1c (hemoglobin A1c, glycosylated hemoglobin) test checks the average amount of sugar (glucose) in the blood over the last 2 to 3 months. It does this by measuring the concentration of glycosylated hemoglobin. As glucose circulates in the blood, some of it binds to hemoglobin A. This is the main form of hemoglobin in adults. Hemoglobin is a red protein that carries oxygen in the red blood cells (RBCs). Once the glucose is bound to the hemoglobin A, it remains there for the life of the red blood cell (about 120 days). This combination of glucose and hemoglobin A is called A1c. Increased glucose in the blood, increases the hemoglobin A1c. A1c levels do not change quickly but will shift as RBCs are replaced. A1c is a valuable test because it enables you to know how your glucose has been controlled over the past 3 months.  5% A1c  Estimated Average Glucose mg/dL: 97  6% W0J  Estimated Average Glucose mg/dL: 811  7% B1Y  Estimated Average Glucose mg/dL: 782  8% N5A  Estimated Average Glucose mg/dL: 213  9% Y8M  Estimated Average Glucose mg/dL: 578  46% N6E  Estimated Average Glucose mg/dL: 952  84% X3K  Estimated Average Glucose mg/dL: 440  10% U7O  Estimated Average Glucose mg/dL: 536 The American Diabetes Association (ADA) recommends testing your A1c level 4 times each year if you have type 1 or type 2 diabetes and use insulin; or 2 times each year if you have type 2 diabetes and do not use insulin. When someone is first diagnosed with diabetes or if control is not good, A1c may be ordered more  frequently. PREPARATION FOR TEST No preparation or fasting is necessary for this blood sample. NORMAL FINDINGS   Adults without diabetes: 2.2 to 4.8%  Children without diabetes: 1.8 to 4.0%  Good diabetic control: 2.5 to 5.9%  Fair diabetic control: 6 to 8%  Poor diabetic control: greater than 8% The values of A1c may be falsely low in pregnancy, in disorders with shortened red blood cell lives, or in sickle cell disease or trait (carrier). The values may be falsely high in disorders with longer red cell lives, or in people with Thallassemia, kidney failure, or iron deficiency anemia. Ranges for normal findings may vary among different laboratories and hospitals. You should always check with your doctor after having lab work or other tests done to discuss the meaning of your test results and whether your values are considered within normal limits. MEANING OF TEST  Your caregiver will go over the test results with you and discuss the importance and meaning of your results, as well as treatment options and the need for additional tests if necessary. If your A1c is greater than 7%, discuss treatment options with your caregiver. OBTAINING THE TEST RESULTS  It is your responsibility to obtain your test results. Ask the lab or department performing the test when and how you will get your results. Document Released: 04/16/2004 Document Revised: 06/17/2011 Document Reviewed: 02/27/2008 [facility] Patient Information 2013 [place], [place]. Preventive Care for Adults, Female A healthy lifestyle and preventive  care can promote health and wellness. Preventive health guidelines for women include the following key practices.  A routine yearly physical is a good way to check with your caregiver about your health and preventive screening. It is a chance to share any concerns and updates on your health, and to receive a thorough exam.  Visit your dentist for a routine exam and preventive care every 6 months.  Brush your teeth twice a day and floss once a day. Good oral hygiene prevents tooth decay and gum disease.  The frequency of eye exams is based on your age, health, family medical history, use of contact lenses, and other factors. Follow your caregiver's recommendations for frequency of eye exams.  Eat a healthy diet. Foods like vegetables, fruits, whole grains, low-fat dairy products, and lean protein foods contain the nutrients you need without too many calories. Decrease your intake of foods high in solid fats, added sugars, and salt. Eat the right amount of calories for you.Get information about a proper diet from your caregiver, if necessary.  Regular physical exercise is one of the most important things you can do for your health. Most adults should get at least 150 minutes of moderate-intensity exercise (any activity that increases your heart rate and causes you to sweat) each week. In addition, most adults need muscle-strengthening exercises on 2 or more days a week.  Maintain a healthy weight. The body mass index (BMI) is a screening tool to identify possible weight problems. It provides an estimate of body fat based on height and weight. Your caregiver can help determine your BMI, and can help you achieve or maintain a healthy weight.For adults 20 years and older:  A BMI below 18.5 is considered underweight.  A BMI of 18.5 to 24.9 is normal.  A BMI of 25 to 29.9 is considered overweight.  A BMI of 30 and above is considered obese.  Maintain normal blood lipids and cholesterol levels by exercising and minimizing your intake of saturated fat. Eat a balanced diet with plenty of fruit and vegetables. Blood tests for lipids and cholesterol should begin at age 66 and be repeated every 5 years. If your lipid or cholesterol levels are high, you are over 50, or you are at high risk for heart disease, you may need your cholesterol levels checked more frequently.Ongoing high lipid and cholesterol  levels should be treated with medicines if diet and exercise are not effective.  If you smoke, find out from your caregiver how to quit. If you do not use tobacco, do not start.  If you are pregnant, do not drink alcohol. If you are breastfeeding, be very cautious about drinking alcohol. If you are not pregnant and choose to drink alcohol, do not exceed 1 drink per day. One drink is considered to be 12 ounces (355 mL) of beer, 5 ounces (148 mL) of wine, or 1.5 ounces (44 mL) of liquor.  Avoid use of street drugs. Do not share needles with anyone. Ask for help if you need support or instructions about stopping the use of drugs.  High blood pressure causes heart disease and increases the risk of stroke. Your blood pressure should be checked at least every 1 to 2 years. Ongoing high blood pressure should be treated with medicines if weight loss and exercise are not effective.  If you are 9 to 47 years old, ask your caregiver if you should take aspirin to prevent strokes.  Diabetes screening involves taking a blood sample to check your  fasting blood sugar level. This should be done once every 3 years, after age 63, if you are within normal weight and without risk factors for diabetes. Testing should be considered at a younger age or be carried out more frequently if you are overweight and have at least 1 risk factor for diabetes.  Breast cancer screening is essential preventive care for women. You should practice "breast self-awareness." This means understanding the normal appearance and feel of your breasts and may include breast self-examination. Any changes detected, no matter how small, should be reported to a caregiver. Women in their 34s and 30s should have a clinical breast exam (CBE) by a caregiver as part of a regular health exam every 1 to 3 years. After age 92, women should have a CBE every year. Starting at age 79, women should consider having a mammography (breast X-ray test) every year.  Women who have a family history of breast cancer should talk to their caregiver about genetic screening. Women at a high risk of breast cancer should talk to their caregivers about having magnetic resonance imaging (MRI) and a mammography every year.  The Pap test is a screening test for cervical cancer. A Pap test can show cell changes on the cervix that might become cervical cancer if left untreated. A Pap test is a procedure in which cells are obtained and examined from the lower end of the uterus (cervix).  Women should have a Pap test starting at age 51.  Between ages 33 and 73, Pap tests should be repeated every 2 years.  Beginning at age 23, you should have a Pap test every 3 years as long as the past 3 Pap tests have been normal.  Some women have medical problems that increase the chance of getting cervical cancer. Talk to your caregiver about these problems. It is especially important to talk to your caregiver if a new problem develops soon after your last Pap test. In these cases, your caregiver may recommend more frequent screening and Pap tests.  The above recommendations are the same for women who have or have not gotten the vaccine for human papillomavirus (HPV).  If you had a hysterectomy for a problem that was not cancer or a condition that could lead to cancer, then you no longer need Pap tests. Even if you no longer need a Pap test, a regular exam is a good idea to make sure no other problems are starting.  If you are between ages 70 and 56, and you have had normal Pap tests going back 10 years, you no longer need Pap tests. Even if you no longer need a Pap test, a regular exam is a good idea to make sure no other problems are starting.  If you have had past treatment for cervical cancer or a condition that could lead to cancer, you need Pap tests and screening for cancer for at least 20 years after your treatment.  If Pap tests have been discontinued, risk factors (such as a  new sexual partner) need to be reassessed to determine if screening should be resumed.  The HPV test is an additional test that may be used for cervical cancer screening. The HPV test looks for the virus that can cause the cell changes on the cervix. The cells collected during the Pap test can be tested for HPV. The HPV test could be used to screen women aged 60 years and older, and should be used in women of any age who have  unclear Pap test results. After the age of 46, women should have HPV testing at the same frequency as a Pap test.  Colorectal cancer can be detected and often prevented. Most routine colorectal cancer screening begins at the age of 37 and continues through age 54. However, your caregiver may recommend screening at an earlier age if you have risk factors for colon cancer. On a yearly basis, your caregiver may provide home test kits to check for hidden blood in the stool. Use of a small camera at the end of a tube, to directly examine the colon (sigmoidoscopy or colonoscopy), can detect the earliest forms of colorectal cancer. Talk to your caregiver about this at age 8, when routine screening begins. Direct examination of the colon should be repeated every 5 to 10 years through age 52, unless early forms of pre-cancerous polyps or small growths are found.  Hepatitis C blood testing is recommended for all people born from [birth year] through 1965 and any individual with known risks for hepatitis C.  Practice safe sex. Use condoms and avoid high-risk sexual practices to reduce the spread of sexually transmitted infections (STIs). STIs include gonorrhea, chlamydia, syphilis, trichomonas, herpes, HPV, and human immunodeficiency virus (HIV). Herpes, HIV, and HPV are viral illnesses that have no cure. They can result in disability, cancer, and death. Sexually active women aged 39 and younger should be checked for chlamydia. Older women with new or multiple partners should also be tested for  chlamydia. Testing for other STIs is recommended if you are sexually active and at increased risk.  Osteoporosis is a disease in which the bones lose minerals and strength with aging. This can result in serious bone fractures. The risk of osteoporosis can be identified using a bone density scan. Women ages 40 and over and women at risk for fractures or osteoporosis should discuss screening with their caregivers. Ask your caregiver whether you should take a calcium supplement or vitamin D to reduce the rate of osteoporosis.  Menopause can be associated with physical symptoms and risks. Hormone replacement therapy is available to decrease symptoms and risks. You should talk to your caregiver about whether hormone replacement therapy is right for you.  Use sunscreen with sun protection factor (SPF) of 30 or more. Apply sunscreen liberally and repeatedly throughout the day. You should seek shade when your shadow is shorter than you. Protect yourself by wearing long sleeves, pants, a wide-brimmed hat, and sunglasses year round, whenever you are outdoors.  Once a month, do a whole body skin exam, using a mirror to look at the skin on your back. Notify your caregiver of new moles, moles that have irregular borders, moles that are larger than a pencil eraser, or moles that have changed in shape or color.  Stay current with required immunizations.  Influenza. You need a dose every fall (or winter). The composition of the flu vaccine changes each year, so being vaccinated once is not enough.  Pneumococcal polysaccharide. You need 1 to 2 doses if you smoke cigarettes or if you have certain chronic medical conditions. You need 1 dose at age 4 (or older) if you have never been vaccinated.  Tetanus, diphtheria, pertussis (Tdap, Td). Get 1 dose of Tdap vaccine if you are younger than age 28, are over 6 and have contact with an infant, are a Research scientist (physical sciences), are pregnant, or simply want to be protected from  whooping cough. After that, you need a Td booster dose every 10 years. Consult your caregiver if  you have not had at least 3 tetanus and diphtheria-containing shots sometime in your life or have a deep or dirty wound.  HPV. You need this vaccine if you are a woman age 34 or younger. The vaccine is given in 3 doses over 6 months.  Measles, mumps, rubella (MMR). You need at least 1 dose of MMR if you were born in 1957 or later. You may also need a second dose.  Meningococcal. If you are age 3 to 61 and a first-year college student living in a residence hall, or have one of several medical conditions, you need to get vaccinated against meningococcal disease. You may also need additional booster doses.  Zoster (shingles). If you are age 46 or older, you should get this vaccine.  Varicella (chickenpox). If you have never had chickenpox or you were vaccinated but received only 1 dose, talk to your caregiver to find out if you need this vaccine.  Hepatitis A. You need this vaccine if you have a specific risk factor for hepatitis A virus infection or you simply wish to be protected from this disease. The vaccine is usually given as 2 doses, 6 to 18 months apart.  Hepatitis B. You need this vaccine if you have a specific risk factor for hepatitis B virus infection or you simply wish to be protected from this disease. The vaccine is given in 3 doses, usually over 6 months. Preventive Services / Frequency Ages 41 to 86  Blood pressure check.** / Every 1 to 2 years.  Lipid and cholesterol check.** / Every 5 years beginning at age 76.  Clinical breast exam.** / Every 3 years for women in their 42s and 30s.  Pap test.** / Every 2 years from ages 2 through 82. Every 3 years starting at age 50 through age 33 or 66 with a history of 3 consecutive normal Pap tests.  HPV screening.** / Every 3 years from ages 13 through ages 73 to 58 with a history of 3 consecutive normal Pap tests.  Hepatitis C blood  test.** / For any individual with known risks for hepatitis C.  Skin self-exam. / Monthly.  Influenza immunization.** / Every year.  Pneumococcal polysaccharide immunization.** / 1 to 2 doses if you smoke cigarettes or if you have certain chronic medical conditions.  Tetanus, diphtheria, pertussis (Tdap, Td) immunization. / A one-time dose of Tdap vaccine. After that, you need a Td booster dose every 10 years.  HPV immunization. / 3 doses over 6 months, if you are 57 and younger.  Measles, mumps, rubella (MMR) immunization. / You need at least 1 dose of MMR if you were born in 1957 or later. You may also need a second dose.  Meningococcal immunization. / 1 dose if you are age 59 to 110 and a first-year college student living in a residence hall, or have one of several medical conditions, you need to get vaccinated against meningococcal disease. You may also need additional booster doses.  Varicella immunization.** / Consult your caregiver.  Hepatitis A immunization.** / Consult your caregiver. 2 doses, 6 to 18 months apart.  Hepatitis B immunization.** / Consult your caregiver. 3 doses usually over 6 months. Ages 32 to 17  Blood pressure check.** / Every 1 to 2 years.  Lipid and cholesterol check.** / Every 5 years beginning at age 29.  Clinical breast exam.** / Every year after age 46.  Mammogram.** / Every year beginning at age 26 and continuing for as long as you are in  good health. Consult with your caregiver.  Pap test.** / Every 3 years starting at age 32 through age 70 or 21 with a history of 3 consecutive normal Pap tests.  HPV screening.** / Every 3 years from ages 50 through ages 68 to 37 with a history of 3 consecutive normal Pap tests.  Fecal occult blood test (FOBT) of stool. / Every year beginning at age 25 and continuing until age 40. You may not need to do this test if you get a colonoscopy every 10 years.  Flexible sigmoidoscopy or colonoscopy.** / Every 5 years  for a flexible sigmoidoscopy or every 10 years for a colonoscopy beginning at age 34 and continuing until age 56.  Hepatitis C blood test.** / For all people born from [birth year] through 1966 and any individual with known risks for hepatitis C.  Skin self-exam. / Monthly.  Influenza immunization.** / Every year.  Pneumococcal polysaccharide immunization.** / 1 to 2 doses if you smoke cigarettes or if you have certain chronic medical conditions.  Tetanus, diphtheria, pertussis (Tdap, Td) immunization.** / A one-time dose of Tdap vaccine. After that, you need a Td booster dose every 10 years.  Measles, mumps, rubella (MMR) immunization. / You need at least 1 dose of MMR if you were born in 1957 or later. You may also need a second dose.  Varicella immunization.** / Consult your caregiver.  Meningococcal immunization.** / Consult your caregiver.  Hepatitis A immunization.** / Consult your caregiver. 2 doses, 6 to 18 months apart.  Hepatitis B immunization.** / Consult your caregiver. 3 doses, usually over 6 months. Ages 37 and over  Blood pressure check.** / Every 1 to 2 years.  Lipid and cholesterol check.** / Every 5 years beginning at age 5.  Clinical breast exam.** / Every year after age 61.  Mammogram.** / Every year beginning at age 10 and continuing for as long as you are in good health. Consult with your caregiver.  Pap test.** / Every 3 years starting at age 20 through age 41 or 57 with a 3 consecutive normal Pap tests. Testing can be stopped between 65 and 70 with 3 consecutive normal Pap tests and no abnormal Pap or HPV tests in the past 10 years.  HPV screening.** / Every 3 years from ages 95 through ages 2 or 55 with a history of 3 consecutive normal Pap tests. Testing can be stopped between 65 and 70 with 3 consecutive normal Pap tests and no abnormal Pap or HPV tests in the past 10 years.  Fecal occult blood test (FOBT) of stool. / Every year beginning at age 35 and  continuing until age 45. You may not need to do this test if you get a colonoscopy every 10 years.  Flexible sigmoidoscopy or colonoscopy.** / Every 5 years for a flexible sigmoidoscopy or every 10 years for a colonoscopy beginning at age 58 and continuing until age 59.  Hepatitis C blood test.** / For all people born from [birth year] through 1966 and any individual with known risks for hepatitis C.  Osteoporosis screening.** / A one-time screening for women ages 83 and over and women at risk for fractures or osteoporosis.  Skin self-exam. / Monthly.  Influenza immunization.** / Every year.  Pneumococcal polysaccharide immunization.** / 1 dose at age 50 (or older) if you have never been vaccinated.  Tetanus, diphtheria, pertussis (Tdap, Td) immunization. / A one-time dose of Tdap vaccine if you are over 65 and have contact with an infant, are a  healthcare worker, or simply want to be protected from whooping cough. After that, you need a Td booster dose every 10 years.  Varicella immunization.** / Consult your caregiver.  Meningococcal immunization.** / Consult your caregiver.  Hepatitis A immunization.** / Consult your caregiver. 2 doses, 6 to 18 months apart.  Hepatitis B immunization.** / Check with your caregiver. 3 doses, usually over 6 months. ** Family history and personal history of risk and conditions may change your caregiver's recommendations. Document Released: 05/21/2001 Document Revised: 06/17/2011 Document Reviewed: 08/20/2010 [facility] Patient Information 2013 [place], [place].

## 2012-07-24 ENCOUNTER — Other Ambulatory Visit: Payer: BC Managed Care – PPO

## 2013-01-15 ENCOUNTER — Other Ambulatory Visit (INDEPENDENT_AMBULATORY_CARE_PROVIDER_SITE_OTHER): Payer: BC Managed Care – PPO

## 2013-01-15 DIAGNOSIS — E785 Hyperlipidemia, unspecified: Secondary | ICD-10-CM

## 2013-01-15 DIAGNOSIS — R7309 Other abnormal glucose: Secondary | ICD-10-CM

## 2013-01-15 DIAGNOSIS — Z0000 Encounter for general adult medical examination without abnormal findings: Secondary | ICD-10-CM

## 2013-01-15 LAB — BASIC METABOLIC PANEL
CO2: 27 mEq/L (ref 19–32)
Calcium: 9.1 mg/dL (ref 8.4–10.5)
Creatinine, Ser: 0.6 mg/dL (ref 0.4–1.2)
GFR: 107.26 mL/min (ref >60.00)
Sodium: 140 mEq/L (ref 135–145)

## 2013-01-15 LAB — CBC WITH DIFFERENTIAL/PLATELET
Basophils Relative: 0.7 % (ref 0.0–3.0)
Eosinophils Absolute: 0.1 10*3/uL (ref 0.0–0.7)
Hemoglobin: 12.1 g/dL (ref 12.0–15.0)
Lymphocytes Relative: 21.8 % (ref 12.0–46.0)
MCHC: 33.9 g/dL (ref 30.0–36.0)
Monocytes Relative: 6.8 % (ref 3.0–12.0)
Neutrophils Relative %: 68.4 % (ref 43.0–77.0)
RBC: 3.92 Mil/uL (ref 3.87–5.11)
WBC: 3.8 10*3/uL — ABNORMAL LOW (ref 4.5–10.5)

## 2013-01-15 LAB — HEPATIC FUNCTION PANEL
AST: 14 U/L (ref 0–37)
Albumin: 4.1 g/dL (ref 3.5–5.2)
Alkaline Phosphatase: 53 U/L (ref 39–117)
Total Protein: 8 g/dL (ref 6.0–8.3)

## 2013-01-15 LAB — LIPID PANEL
HDL: 48.4 mg/dL (ref >39.00)
Total CHOL/HDL Ratio: 4
Triglycerides: 117 mg/dL (ref 0.0–149.0)
VLDL: 23.4 mg/dL (ref 0.0–40.0)

## 2013-01-15 LAB — HEMOGLOBIN A1C: Hgb A1c MFr Bld: 5.8 % (ref 4.6–6.5)

## 2013-01-15 LAB — MICROALBUMIN / CREATININE URINE RATIO: Creatinine,U: 190.3 mg/dL

## 2013-01-16 LAB — VITAMIN D 25 HYDROXY (VIT D DEFICIENCY, FRACTURES): Vit D, 25-Hydroxy: 44 ng/mL (ref 30–89)

## 2013-01-25 ENCOUNTER — Encounter: Payer: Self-pay | Admitting: Internal Medicine

## 2013-01-25 ENCOUNTER — Ambulatory Visit (INDEPENDENT_AMBULATORY_CARE_PROVIDER_SITE_OTHER): Payer: BC Managed Care – PPO | Admitting: Internal Medicine

## 2013-01-25 VITALS — BP 124/76 | HR 89 | Temp 98.4°F | Ht 60.0 in | Wt 118.0 lb

## 2013-01-25 DIAGNOSIS — Z0000 Encounter for general adult medical examination without abnormal findings: Secondary | ICD-10-CM

## 2013-01-25 DIAGNOSIS — R7309 Other abnormal glucose: Secondary | ICD-10-CM

## 2013-01-25 NOTE — Patient Instructions (Signed)
150 minutes of exercise weeks  ,  Maintain healthy life style.  Marland Kitchen Avoid trans fats and processed foods;  Increase fresh fruits and veges to 5 servings per day. And avoid sweet beverages  Including tea and juice. Yearly check

## 2013-01-25 NOTE — Progress Notes (Signed)
Chief Complaint  Patient presents with  . Annual Exam    HPI: Patient comes in today for Preventive Health Care visit  Just had wisdom teeth removed  No fever some sore.  No change in health to get flu vaccine with son soon  seesn gyne has somewhat enlarged uterus  Following   ROS:  GEN/ HEENT: No fever, significant weight changes sweats headaches vision problems hearing changes, CV/ PULM; No chest pain shortness of breath cough, syncope,edema  change in exercise tolerance. GI /GU: No adominal pain, vomiting, change in bowel habits. No blood in the stool. No significant GU symptoms. SKIN/HEME: ,no acute skin rashes suspicious lesions or bleeding. No lymphadenopathy, nodules, masses.  NEURO/ PSYCH:  No neurologic signs such as weakness numbness. No depression anxiety. IMM/ Allergy: No unusual infections.  Allergy .   No recurrence of hives   REST of 12 system review negative except as per HPI   Past Medical History  Diagnosis Date  . Breast cancer 2005    left dcis  . Diabetes, gestational   . Scoliosis   . 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    Melissa Brown DIABETES 02/09/2007    Qualifier: History of  By: Fabian Sharp MD, Neta Mends   . Allergic     to grass and moldsby skin testing     Family History  Problem Relation Age of Onset  . Diabetes Mother   . Thyroid disease Mother   . Diabetes Father   . Heart disease Father   . Diabetes Other     History   Social History  . Marital Status: Married    Spouse Name: N/A    Number of Children: N/A  . Years of Education: N/A   Social History Main Topics  . Smoking status: Never Smoker   . Smokeless tobacco: None  . Alcohol Use: 0.5 oz/week    1 drink(s) per week  . Drug Use: No  . Sexual Activity: None   Other Topics Concern  . 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 betamethasone dipropionate (DIPROLENE) 0.05 % cream Apply topically 2 (two) times daily.  15 g  2  . ferrous sulfate 325 (65 FE) MG tablet Take 325 mg by mouth daily with breakfast. During period      . Multiple Vitamins-Calcium (ONE-A-DAY WOMENS PO) Take by mouth daily.         No facility-administered encounter medications on file as of 01/25/2013.    EXAM:  BP 124/76  Pulse 89  Temp(Src) 98.4 F (36.9 C) (Oral)  Ht 5' (1.524 m)  Wt 118 lb (53.524 kg)  BMI 23.05 kg/m2  SpO2 98%  LMP 01/18/2013  Body mass index is 23.05 kg/(m^2).  Physical Exam: Vital signs reviewed BJY:NWGN is a well-developed well-nourished alert cooperative   female who appears her stated age in no acute distress.  HEENT: normocephalic atraumatic , Eyes: PERRL EOM's full, conjunctiva clear, Nares: paten,t no deformity discharge or tenderness., Ears: no deformity EAC's clear TMs with normal landmarks. Mouth:  clear OP, no lesions, edema.  Moist mucous membranes. Dentition in adequate repair.mildly tender left ac area  NECK: supple without masses, thyromegaly or bruits. Breast left reconstructed right no masses or axillary changes . CHEST/PULM:  Clear to auscultation and percussion breath sounds equal no wheeze , rales or rhonchi. No chest wall deformities or tenderness. CV: PMI is nondisplaced, S1 S2 no gallops, murmurs, rubs. Peripheral pulses are full without delay.No JVD .  ABDOMEN: Bowel sounds normal nontender  No guard or rebound, no hepato splenomegal no CVA tenderness.  Extremtities:  No clubbing cyanosis or edema, no acute joint swelling or redness no focal atrophy Scoliosis  NEURO:  Oriented x3, cranial nerves 3-12 appear to be intact, no obvious focal weakness,gait within normal limits no abnormal reflexes or asymmetrical SKIN: No  acute rashes normal turgor, color, no bruising or petechiae. PSYCH: Oriented, good eye contact, no obvious depression anxiety, cognition and judgment appear normal. LN: no cervical axillary inguinal adenopathy Pelvic as per gyne Lab Results  Component Value Date   WBC 3.8* 01/15/2013   HGB 12.1 01/15/2013   HCT 35.8* 01/15/2013   PLT 353.0 01/15/2013   GLUCOSE 106* 01/15/2013   CHOL 200 01/15/2013   TRIG 117.0 01/15/2013   HDL 48.40 01/15/2013   LDLDIRECT 163.2 01/17/2012   LDLCALC 128* 01/15/2013   ALT 14 01/15/2013   AST 14 01/15/2013   NA 140 01/15/2013   K 4.1 01/15/2013   CL 105 01/15/2013   CREATININE 0.6 01/15/2013   BUN 6 01/15/2013   CO2 27 01/15/2013   TSH 0.91 01/15/2013   HGBA1C 5.8 01/15/2013   MICROALBUR 0.5 01/15/2013    ASSESSMENT AND PLAN:  Discussed the following assessment and plan:  Routine general medical examination at a health care facility  FASTING HYPERGLYCEMIA Has ntever had  Ht removed from hx  Counseled regarding healthy nutrition, exercise, sleep, injury prevention, calcium vit d and healthy weight .  Patient Care Team: Madelin Headings, MD as PCP - General Christianne Borrow as Referring Physician (Obstetrics and Gynecology) Verdie Mosher debra  Patient Instructions  150 minutes of exercise weeks  ,  Maintain healthy life style.  Marland Kitchen Avoid trans fats and processed foods;  Increase fresh fruits and veges to 5 servings per day. And avoid sweet beverages  Including tea and juice. Yearly check       Neta Mends. Boyce Keltner M.D.  Health Maintenance  Topic Date Due  . Influenza Vaccine  11/06/2012  . Pap Smear  03/10/2015  . Tetanus/tdap  07/07/2016   Health Maintenance Review

## 2013-01-29 ENCOUNTER — Ambulatory Visit (INDEPENDENT_AMBULATORY_CARE_PROVIDER_SITE_OTHER): Payer: BC Managed Care – PPO

## 2013-01-29 DIAGNOSIS — Z23 Encounter for immunization: Secondary | ICD-10-CM

## 2013-06-30 ENCOUNTER — Encounter: Payer: Self-pay | Admitting: Internal Medicine

## 2013-06-30 ENCOUNTER — Ambulatory Visit (INDEPENDENT_AMBULATORY_CARE_PROVIDER_SITE_OTHER): Payer: BC Managed Care – PPO | Admitting: Internal Medicine

## 2013-06-30 VITALS — BP 110/80 | Temp 97.6°F | Ht 60.0 in | Wt 122.0 lb

## 2013-06-30 DIAGNOSIS — R81 Glycosuria: Secondary | ICD-10-CM

## 2013-06-30 DIAGNOSIS — R109 Unspecified abdominal pain: Secondary | ICD-10-CM

## 2013-06-30 DIAGNOSIS — R350 Frequency of micturition: Secondary | ICD-10-CM

## 2013-06-30 DIAGNOSIS — R300 Dysuria: Secondary | ICD-10-CM | POA: Insufficient documentation

## 2013-06-30 LAB — POCT URINALYSIS DIPSTICK
BILIRUBIN UA: NEGATIVE
GLUCOSE UA: 2
Ketones, UA: NEGATIVE
NITRITE UA: NEGATIVE
PH UA: 6
Protein, UA: NEGATIVE
SPEC GRAV UA: 1.01
Urobilinogen, UA: 0.2

## 2013-06-30 LAB — GLUCOSE, POCT (MANUAL RESULT ENTRY): POC Glucose: 126 mg/dl — AB (ref 70–99)

## 2013-06-30 MED ORDER — NITROFURANTOIN MONOHYD MACRO 100 MG PO CAPS: 100.0000 mg | ORAL_CAPSULE | Freq: Two times a day (BID) | ORAL | Status: DC

## 2013-06-30 NOTE — Patient Instructions (Addendum)
Symptoms consistent with a bladder infection or lower urine infection. Began antibiotic like to know about culture results. Should be feeling better within 3-5 days. Contact us if not.  Your blood sugar is normal today.  Urinary Tract Infection Urinary tract infections (UTIs) can develop anywhere along your urinary tract. Your urinary tract is your body's drainage system for removing wastes and extra water. Your urinary tract includes two kidneys, two ureters, a bladder, and a urethra. Your kidneys are a pair of bean-shaped organs. Each kidney is about the size of your fist. They are located below your ribs, one on each side of your spine. CAUSES Infections are caused by microbes, which are microscopic organisms, including fungi, viruses, and bacteria. These organisms are so small that they can only be seen through a microscope. Bacteria are the microbes that most commonly cause UTIs. SYMPTOMS  Symptoms of UTIs may vary by age and gender of the patient and by the location of the infection. Symptoms in young women typically include a frequent and intense urge to urinate and a painful, burning feeling in the bladder or urethra during urination. Older women and men are more likely to be tired, shaky, and weak and have muscle aches and abdominal pain. A fever may mean the infection is in your kidneys. Other symptoms of a kidney infection include pain in your back or sides below the ribs, nausea, and vomiting. DIAGNOSIS To diagnose a UTI, your caregiver will ask you about your symptoms. Your caregiver also will ask to provide a urine sample. The urine sample will be tested for bacteria and white blood cells. White blood cells are made by your body to help fight infection. TREATMENT  Typically, UTIs can be treated with medication. Because most UTIs are caused by a bacterial infection, they usually can be treated with the use of antibiotics. The choice of antibiotic and length of treatment depend on your  symptoms and the type of bacteria causing your infection. HOME CARE INSTRUCTIONS  If you were prescribed antibiotics, take them exactly as your caregiver instructs you. Finish the medication even if you feel better after you have only taken some of the medication.  Drink enough water and fluids to keep your urine clear or pale yellow.  Avoid caffeine, tea, and carbonated beverages. They tend to irritate your bladder.  Empty your bladder often. Avoid holding urine for long periods of time.  Empty your bladder before and after sexual intercourse.  After a bowel movement, women should cleanse from front to back. Use each tissue only once. SEEK MEDICAL CARE IF:   You have back pain.  You develop a fever.  Your symptoms do not begin to resolve within 3 days. SEEK IMMEDIATE MEDICAL CARE IF:   You have severe back pain or lower abdominal pain.  You develop chills.  You have nausea or vomiting.  You have continued burning or discomfort with urination. MAKE SURE YOU:   Understand these instructions.  Will watch your condition.  Will get help right away if you are not doing well or get worse. Document Released: 01/02/2005 Document Revised: 09/24/2011 Document Reviewed: 05/03/2011 [facility] Patient Information 2014 [place].

## 2013-06-30 NOTE — Progress Notes (Signed)
Pre visit review using our clinic review tool, if applicable. No additional management support is needed unless otherwise documented below in the visit note.   Chief Complaint  Patient presents with  . Dysuria  . Abdominal Pain  . Urinary Frequency    HPI: Comes in today with 2-1/2 days of increased urinary frequency urgency and today burning dysuria. She drank a lot of water felt some better shot saw little specks of blood this morning no fever flank pain. She does have fibroids but this feels different. Last menstrual period March 7. Struve UTI many years ago. ROS: See pertinent positives and negatives per HPI. No fever chills  nvd   Past Medical History  Diagnosis Date  . Breast cancer 2005    left dcis  . Diabetes, gestational   . Scoliosis   . 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    Doristine Counter DIABETES 02/09/2007    Qualifier: History of  By: Regis Bill MD, Standley Brooking   . Allergic     to grass and moldsby skin testing     Family History  Problem Relation Age of Onset  . Diabetes Mother   . Thyroid disease Mother   . Diabetes Father   . Heart disease Father   . Diabetes Other     History   Social History  . Marital Status: Married    Spouse Name: N/A    Number of Children: N/A  . Years of Education: N/A   Social History Main Topics  . Smoking status: Never Smoker   . Smokeless tobacco: None  . Alcohol Use: 0.5 oz/week    1 drink(s) per week  . Drug Use: No  . Sexual Activity: None   Other Topics Concern  . 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 betamethasone dipropionate (DIPROLENE) 0.05 % cream Apply topically 2 (two) times daily.  . Cholecalciferol  (VITAMIN D-3 PO) Take by mouth.  . ferrous sulfate 325 (65 FE) MG tablet Take 325 mg by mouth daily with breakfast. During period  . Multiple Vitamins-Calcium (ONE-A-DAY WOMENS PO) Take by mouth daily.    . nitrofurantoin, macrocrystal-monohydrate, (MACROBID) 100 MG capsule Take 1 capsule (100 mg total) by mouth 2 (two) times daily.    EXAM:  BP 110/80  Temp(Src) 97.6 F (36.4 C) (Oral)  Ht 5' (1.524 m)  Wt 122 lb (55.339 kg)  BMI 23.83 kg/m2  LMP 06/12/2013  Body mass index is 23.83 kg/(m^2).  GENERAL: vitals reviewed and listed above, alert, oriented, appears well hydrated and in no acute distress HEENT: atraumatic, conjunctiva  clear, no obvious abnormalities on inspection of external nose and ears OP : no lesion edema or exudate  \CV: HRRR, no clubbing cyanosis or  peripheral edema nl cap refill  Abdomen soft without organomegaly guarding or rebound some tenderness over the suprapubic area. No flank pain MS: moves all extremities without noticeable focal  abnormality PSYCH: pleasant and cooperative, no obvious depression or anxiety bg 126 pp lunch  ASSESSMENT AND PLAN:  Discussed the following assessment and  plan:  Dysuria - sx cw acute uti  cystitis  cand s empiric rx otherwise - Plan: POC Urinalysis Dipstick, Urine culture, POCT glucose (manual entry)  Urinary frequency - Plan: POC Urinalysis Dipstick, Urine culture  Abdominal pain, unspecified site - Plan: POC Urinalysis Dipstick, Urine culture  Glucosuria - pp cbg 126 normal   -Patient advised to return or notify health care team  if symptoms worsen ,persist or new concerns arise.  Patient Instructions  Symptoms consistent with a bladder infection or lower urine infection. Began antibiotic like to know about culture results. Should be feeling better within 3-5 days. Contact us if not.  Your blood sugar is normal today.  Urinary Tract Infection Urinary tract infections (UTIs) can develop anywhere along your  urinary tract. Your urinary tract is your body's drainage system for removing wastes and extra water. Your urinary tract includes two kidneys, two ureters, a bladder, and a urethra. Your kidneys are a pair of bean-shaped organs. Each kidney is about the size of your fist. They are located below your ribs, one on each side of your spine. CAUSES Infections are caused by microbes, which are microscopic organisms, including fungi, viruses, and bacteria. These organisms are so small that they can only be seen through a microscope. Bacteria are the microbes that most commonly cause UTIs. SYMPTOMS  Symptoms of UTIs may vary by age and gender of the patient and by the location of the infection. Symptoms in young women typically include a frequent and intense urge to urinate and a painful, burning feeling in the bladder or urethra during urination. Older women and men are more likely to be tired, shaky, and weak and have muscle aches and abdominal pain. A fever may mean the infection is in your kidneys. Other symptoms of a kidney infection include pain in your back or sides below the ribs, nausea, and vomiting. DIAGNOSIS To diagnose a UTI, your caregiver will ask you about your symptoms. Your caregiver also will ask to provide a urine sample. The urine sample will be tested for bacteria and white blood cells. White blood cells are made by your body to help fight infection. TREATMENT  Typically, UTIs can be treated with medication. Because most UTIs are caused by a bacterial infection, they usually can be treated with the use of antibiotics. The choice of antibiotic and length of treatment depend on your symptoms and the type of bacteria causing your infection. HOME CARE INSTRUCTIONS  If you were prescribed antibiotics, take them exactly as your caregiver instructs you. Finish the medication even if you feel better after you have only taken some of the medication.  Drink enough water and fluids to keep your urine  clear or pale yellow.  Avoid caffeine, tea, and carbonated beverages. They tend to irritate your bladder.  Empty your bladder often. Avoid holding urine for long periods of time.  Empty your bladder before and after sexual intercourse.  After a bowel movement, women should cleanse from front to back. Use each tissue only once. SEEK MEDICAL CARE IF:   You have back pain.  You develop a fever.  Your symptoms do not begin to resolve within 3 days. SEEK IMMEDIATE MEDICAL CARE IF:   You have severe back pain or lower abdominal pain.  You develop chills.  You have nausea or vomiting.  You have continued burning or discomfort with urination. MAKE SURE YOU:   Understand these instructions.  Will watch your condition.  Will get help right away if you  are not doing well or get worse. Document Released: 01/02/2005 Document Revised: 09/24/2011 Document Reviewed: 05/03/2011 [facility] Patient Information 2014 [place].      Standley Brooking. Panosh M.D.

## 2013-07-03 LAB — URINE CULTURE: Colony Count: >=100000

## 2013-07-04 NOTE — Progress Notes (Signed)
Quick Note:  Tell patient that urine culture shows e coli sensitive to medication given . Should resolve with current treatment .FU if not better. ______ 

## 2013-08-25 ENCOUNTER — Encounter: Payer: Self-pay | Admitting: Cardiology

## 2013-08-25 DIAGNOSIS — I059 Rheumatic mitral valve disease, unspecified: Secondary | ICD-10-CM | POA: Insufficient documentation

## 2014-01-20 ENCOUNTER — Ambulatory Visit (INDEPENDENT_AMBULATORY_CARE_PROVIDER_SITE_OTHER): Payer: BC Managed Care – PPO | Admitting: Family Medicine

## 2014-01-20 DIAGNOSIS — Z23 Encounter for immunization: Secondary | ICD-10-CM

## 2014-01-24 ENCOUNTER — Other Ambulatory Visit (INDEPENDENT_AMBULATORY_CARE_PROVIDER_SITE_OTHER): Payer: BC Managed Care – PPO

## 2014-01-24 DIAGNOSIS — E559 Vitamin D deficiency, unspecified: Secondary | ICD-10-CM

## 2014-01-24 DIAGNOSIS — Z0000 Encounter for general adult medical examination without abnormal findings: Secondary | ICD-10-CM

## 2014-01-24 LAB — CBC WITH DIFFERENTIAL/PLATELET
Basophils Absolute: 0 10*3/uL (ref 0.0–0.1)
Basophils Relative: 0.6 % (ref 0.0–3.0)
EOS PCT: 4.7 % (ref 0.0–5.0)
Eosinophils Absolute: 0.3 10*3/uL (ref 0.0–0.7)
HCT: 39.5 % (ref 36.0–46.0)
HEMOGLOBIN: 12.9 g/dL (ref 12.0–15.0)
Lymphocytes Relative: 21.1 % (ref 12.0–46.0)
Lymphs Abs: 1.2 10*3/uL (ref 0.7–4.0)
MCHC: 32.7 g/dL (ref 30.0–36.0)
MCV: 94.9 fl (ref 78.0–100.0)
MONOS PCT: 7.2 % (ref 3.0–12.0)
Monocytes Absolute: 0.4 10*3/uL (ref 0.1–1.0)
Neutro Abs: 3.6 10*3/uL (ref 1.4–7.7)
Neutrophils Relative %: 66.4 % (ref 43.0–77.0)
PLATELETS: 317 10*3/uL (ref 150.0–400.0)
RBC: 4.16 Mil/uL (ref 3.87–5.11)
RDW: 13.2 % (ref 11.5–15.5)
WBC: 5.5 10*3/uL (ref 4.0–10.5)

## 2014-01-24 LAB — LIPID PANEL
CHOLESTEROL: 237 mg/dL — AB (ref 0–200)
HDL: 45.1 mg/dL (ref >39.00)
LDL CALC: 153 mg/dL — AB (ref 0–99)
NonHDL: 191.9
TRIGLYCERIDES: 195 mg/dL — AB (ref 0.0–149.0)
Total CHOL/HDL Ratio: 5
VLDL: 39 mg/dL (ref 0.0–40.0)

## 2014-01-24 LAB — BASIC METABOLIC PANEL
BUN: 11 mg/dL (ref 6–23)
CO2: 28 mEq/L (ref 19–32)
Calcium: 9.2 mg/dL (ref 8.4–10.5)
Chloride: 100 mEq/L (ref 96–112)
Creatinine, Ser: 0.6 mg/dL (ref 0.4–1.2)
GFR: 104.88 mL/min (ref >60.00)
Glucose, Bld: 117 mg/dL — ABNORMAL HIGH (ref 70–99)
POTASSIUM: 4.6 meq/L (ref 3.5–5.1)
Sodium: 137 mEq/L (ref 135–145)

## 2014-01-24 LAB — HEPATIC FUNCTION PANEL
ALT: 24 U/L (ref 0–35)
AST: 23 U/L (ref 0–37)
Albumin: 3.8 g/dL (ref 3.5–5.2)
Alkaline Phosphatase: 54 U/L (ref 39–117)
BILIRUBIN DIRECT: 0 mg/dL (ref 0.0–0.3)
Total Bilirubin: 0.3 mg/dL (ref 0.2–1.2)
Total Protein: 8.2 g/dL (ref 6.0–8.3)

## 2014-01-24 LAB — VITAMIN D 25 HYDROXY (VIT D DEFICIENCY, FRACTURES): VITD: 29.21 ng/mL — ABNORMAL LOW (ref 30.00–100.00)

## 2014-01-24 LAB — TSH: TSH: 0.7 u[IU]/mL (ref 0.35–4.50)

## 2014-01-31 ENCOUNTER — Encounter: Payer: Self-pay | Admitting: Internal Medicine

## 2014-01-31 ENCOUNTER — Ambulatory Visit (INDEPENDENT_AMBULATORY_CARE_PROVIDER_SITE_OTHER): Payer: BC Managed Care – PPO | Admitting: Internal Medicine

## 2014-01-31 VITALS — BP 128/80 | Temp 98.7°F | Ht 59.5 in | Wt 124.3 lb

## 2014-01-31 DIAGNOSIS — Z0000 Encounter for general adult medical examination without abnormal findings: Secondary | ICD-10-CM

## 2014-01-31 DIAGNOSIS — R7301 Impaired fasting glucose: Secondary | ICD-10-CM

## 2014-01-31 DIAGNOSIS — R7989 Other specified abnormal findings of blood chemistry: Secondary | ICD-10-CM

## 2014-01-31 DIAGNOSIS — E785 Hyperlipidemia, unspecified: Secondary | ICD-10-CM

## 2014-01-31 DIAGNOSIS — Z853 Personal history of malignant neoplasm of breast: Secondary | ICD-10-CM

## 2014-01-31 DIAGNOSIS — L309 Dermatitis, unspecified: Secondary | ICD-10-CM

## 2014-01-31 LAB — GLUCOSE, POCT (MANUAL RESULT ENTRY): POC Glucose: 115 mg/dl — AB (ref 70–99)

## 2014-01-31 MED ORDER — BETAMETHASONE DIPROPIONATE 0.05 % EX CREA: TOPICAL_CREAM | Freq: Two times a day (BID) | CUTANEOUS | Status: DC

## 2014-01-31 NOTE — Patient Instructions (Addendum)
Intensify lifestyle interventions. Track exercise and dietitian intake. Limit  simple carbs some protein at each meal .   Your blood tests are consistent with  Pre diabetes  Sometimes adding metformin is advise but intensive life style intervention first .   Healthy lifestyle includes : At least 150 minutes of exercise weeks  , weight at healthy levels, which is usually   BMI 19-25. Avoid trans fats and processed foods;  Increase fresh fruits and veges to 5 servings per day. And avoid sweet beverages including tea and juice. Mediterranean diet with olive oil and nuts have been noted to be heart and brain healthy . Avoid tobacco products . Limit  alcohol to  7 per week for women and 14 servings for men.  Get adequate sleep . Wear seat belts . Don't text and drive .   Plan   Fasting bg  hgba1c  In 4-6 months . OV depending on results  Consider seeing dietician  If needed.     Food Choices to Lower Your Triglycerides  Triglycerides are a type of fat in your blood. High levels of triglycerides can increase the risk of heart disease and stroke. If your triglyceride levels are high, the foods you eat and your eating habits are very important. Choosing the right foods can help lower your triglycerides.  WHAT GENERAL GUIDELINES DO I NEED TO FOLLOW?  Lose weight if you are overweight.   Limit or avoid alcohol.   Fill one half of your plate with vegetables and green salads.   Limit fruit to two servings a day. Choose fruit instead of juice.   Make one fourth of your plate whole grains. Look for the word "whole" as the first word in the ingredient list.  Fill one fourth of your plate with lean protein foods.  Enjoy fatty fish (such as salmon, mackerel, sardines, and tuna) three times a week.   Choose healthy fats.   Limit foods high in starch and sugar.  Eat more home-cooked food and less restaurant, buffet, and fast food.  Limit fried foods.  Cook foods using methods other  than frying.  Limit saturated fats.  Check ingredient lists to avoid foods with partially hydrogenated oils (trans fats) in them. WHAT FOODS CAN I EAT?  Grains Whole grains, such as whole wheat or whole grain breads, crackers, cereals, and pasta. Unsweetened oatmeal, bulgur, barley, quinoa, or brown rice. Corn or whole wheat flour tortillas.  Vegetables Fresh or frozen vegetables (raw, steamed, roasted, or grilled). Green salads. Fruits All fresh, canned (in natural juice), or frozen fruits. Meat and Other Protein Products Ground beef (85% or leaner), grass-fed beef, or beef trimmed of fat. Skinless chicken or [place]. Ground chicken or [place]. Pork trimmed of fat. All fish and seafood. Eggs. Dried beans, peas, or lentils. Unsalted nuts or seeds. Unsalted canned or dry beans. Dairy Low-fat dairy products, such as skim or 1% milk, 2% or reduced-fat cheeses, low-fat ricotta or cottage cheese, or plain low-fat yogurt. Fats and Oils Tub margarines without trans fats. Light or reduced-fat mayonnaise and salad dressings. Avocado. Safflower, olive, or canola oils. Natural peanut or almond butter. The items listed above may not be a complete list of recommended foods or beverages. Contact your dietitian for more options. WHAT FOODS ARE NOT RECOMMENDED?  Grains White bread. White pasta. White rice. Cornbread. Bagels, pastries, and croissants. Crackers that contain trans fat. Vegetables White potatoes. Corn. Creamed or fried vegetables. Vegetables in a cheese sauce. Fruits Dried fruits. Canned fruit  in light or heavy syrup. Fruit juice. Meat and Other Protein Products Fatty cuts of meat. Ribs, chicken wings, bacon, sausage, bologna, salami, chitterlings, fatback, hot dogs, bratwurst, and packaged luncheon meats. Dairy Whole or 2% milk, cream, half-and-half, and cream cheese. Whole-fat or sweetened yogurt. Full-fat cheeses. Nondairy creamers and whipped toppings. Processed cheese, cheese spreads, or  cheese curds. Sweets and Desserts Corn syrup, sugars, honey, and molasses. Candy. Jam and jelly. Syrup. Sweetened cereals. Cookies, pies, cakes, donuts, muffins, and ice cream. Fats and Oils Butter, stick margarine, lard, shortening, ghee, or bacon fat. Coconut, palm kernel, or palm oils. Beverages Alcohol. Sweetened drinks (such as sodas, lemonade, and fruit drinks or punches). The items listed above may not be a complete list of foods and beverages to avoid. Contact your dietitian for more information. Document Released: 01/11/2004 Document Revised: 03/30/2013 Document Reviewed: 01/27/2013 [facility] Patient Information 2015 [place], [place]. This information is not intended to replace advice given to you by your health care provider. Make sure you discuss any questions you have with your health care provider.

## 2014-01-31 NOTE — Progress Notes (Signed)
Chief Complaint  Patient presents with  . Annual Exam    HPI: Patient comes in today for Preventive Health Care visit  No major change in health status since last visit . Sees oncologist [facility] year l breast cancer hx .  No major illnesses  Health Maintenance  Topic Date Due  . Influenza Vaccine  11/07/2014  . Pap Smear  03/10/2015  . Tetanus/tdap  07/07/2016   Health Maintenance Review LIFESTYLE:  Exercise:   Aerobic 2 x per week  Tobacco/ETS: no no Alcohol: no Sugar beverages: ocass Sleep: 7 Drug use: no Menses  Monthly  Enlarged uterus.  Some healthy 1 week  Breast  Yearly oncology Due pap 2016  gyne   ROS:  GEN/ HEENT: No fever, significant weight changes sweats headaches vision problems hearing changes, CV/ PULM; No chest pain shortness of breath cough, syncope,edema  change in exercise tolerance. GI /GU: No adominal pain, vomiting, change in bowel habits. No blood in the stool. No significant GU symptoms. SKIN/HEME:  has itchy rash neck back  f neck for a while . rx by derm with steroid cr as needed No   suspicious lesions or bleeding. No lymphadenopathy, nodules, masses.  NEURO/ PSYCH:  No neurologic signs such as weakness numbness. No depression anxiety. IMM/ Allergy: No unusual infections.  Allergy .  REST of 12 system review negative except as per HPI   Past Medical History  Diagnosis Date  . Breast cancer 2005    left dcis  . Diabetes, gestational   . Scoliosis   . 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    Doristine Counter DIABETES 02/09/2007    Qualifier: History of  By: Regis Bill MD, Standley Brooking   . Allergic     to grass and moldsby skin testing   . Palpitations     tachy, supressed with atenolol  . MVP (mitral valve prolapse)     ANTERIOR LEAFLET, MILD MR  . Breast cancer   . Thyroid disease     HYPERTHYROIDISM    Family History  Problem Relation Age of Onset  . Diabetes Mother   . Thyroid disease  Mother   . Hypertension Mother   . Diabetes Father   . Heart disease Father   . Diabetes Other     History   Social History  . Marital Status: Married    Spouse Name: N/A    Number of Children: N/A  . Years of Education: N/A   Social History Main Topics  . Smoking status: Never Smoker   . Smokeless tobacco: None  . Alcohol Use: 0.5 oz/week    1 drink(s) per week  . Drug Use: No  . Sexual Activity: None   Other Topics Concern  . 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 betamethasone dipropionate (DIPROLENE) 0.05 % cream Apply topically 2 (two) times daily. To affected area; not on face  . Cholecalciferol (VITAMIN D-3 PO) Take by mouth.  . ferrous sulfate 325 (65 FE) MG tablet Take 325 mg by mouth daily with breakfast.  During period  . Multiple Vitamins-Calcium (ONE-A-DAY WOMENS PO) Take by mouth daily.    . [DISCONTINUED] betamethasone dipropionate (DIPROLENE) 0.05 % cream Apply topically 2 (two) times daily.  . [DISCONTINUED] nitrofurantoin, macrocrystal-monohydrate, (MACROBID) 100 MG capsule Take 1 capsule (100 mg total) by mouth 2 (two) times daily.    EXAM:  BP 128/80  Temp(Src) 98.7 F (37.1 C) (Oral)  Ht 4' 11.5" (1.511 m)  Wt 124 lb 4.8 oz (56.382 kg)  BMI 24.70 kg/m2  Body mass index is 24.7 kg/(m^2).  Physical Exam: Vital signs reviewed HCW:CBJS is a well-developed well-nourished alert cooperative    who appearsr stated age in no acute distress.  HEENT: normocephalic atraumatic , Eyes: PERRL EOM's full, conjunctiva clear, Nares: paten,t no deformity discharge or tenderness., Ears: no deformity EAC's clear TMs with normal landmarks. Mouth: clear OP, no lesions, edema.  Moist mucous membranes. Dentition in  adequate repair. Glasses  NECK: supple without masses, thyromegaly or bruits. CHEST/PULM:  Clear to auscultation and percussion breath sounds equal no wheeze , rales or rhonchi. No chest wall deformities or tenderness. CV: PMI is nondisplaced, S1 S2 no gallops, murmurs, rubs. Peripheral pulses are full without delay.No JVD .  dont hear click today  Breast: right  normal by inspection . No dimpling, discharge, masses, tenderness or discharge . Left reconstructed  ABDOMEN: Bowel sounds normal nontender  No guard or rebound, no hepato splenomegal no CVA tenderness. Extremtities:  No clubbing cyanosis or edema, no acute joint swelling or redness no focal atrophy  NEURO:  Oriented x3, cranial nerves 3-12 appear to be intact, no obvious focal weakness,gait within normal limits no abnormal reflexes or asymmetrical SKIN:  normal turgor, color, no bruising or petechiae. right neck and posterior thickened dermatitis eczematous? No scale  PSYCH: Oriented, good eye contact, no obvious depression anxiety, cognition and judgment appear normal. LN: no cervical axillary inguinal adenopathy  Lab Results  Component Value Date   WBC 5.5 01/24/2014   HGB 12.9 01/24/2014   HCT 39.5 01/24/2014   PLT 317.0 01/24/2014   GLUCOSE 117* 01/24/2014   CHOL 237* 01/24/2014   TRIG 195.0* 01/24/2014   HDL 45.10 01/24/2014   LDLDIRECT 163.2 01/17/2012   LDLCALC 153* 01/24/2014   ALT 24 01/24/2014   AST 23 01/24/2014   NA 137 01/24/2014   K 4.6 01/24/2014   CL 100 01/24/2014   CREATININE 0.6 01/24/2014   BUN 11 01/24/2014   CO2 28 01/24/2014   TSH 0.70 01/24/2014   HGBA1C 5.8 01/15/2013   MICROALBUR 0.5 01/15/2013    ASSESSMENT AND PLAN:  Discussed the following assessment and plan:  Routine general medical examination at a health care facility  Fasting hyperglycemia - strong fam hx hx gestational, intensive lsi pre diabetic and fu labs 4-6 months consider metformin - Plan: POC Glucose (CBG)  Hyperlipidemia  - elev tg cq her hyperglycemia  BREAST CANCER, HX OF  Dermatitis - neck rx by derm steroid refill rx as needed for now  Low serum vitamin D - 29 but has osteopenia inc to vit d 2000 iu or make sure taking daily 1000 iu Pt prefers to work on lsi in lieu of referral at this time to dietician Had loemein type breakfast rice noodles for breakfast. 2 small rice servings with meals bid usually  Patient Care Team: Burnis Medin, MD as PCP - General Dorien Chihuahua, MD as Referring Physician (Obstetrics and Gynecology) Oleta Mouse debra  Patient Instructions  Intensify lifestyle interventions. Track exercise and dietitian  intake. Limit  simple carbs some protein at each meal .   Your blood tests are consistent with  Pre diabetes  Sometimes adding metformin is advise but intensive life style intervention first .   Healthy lifestyle includes : At least 150 minutes of exercise weeks  , weight at healthy levels, which is usually   BMI 19-25. Avoid trans fats and processed foods;  Increase fresh fruits and veges to 5 servings per day. And avoid sweet beverages including tea and juice. Mediterranean diet with olive oil and nuts have been noted to be heart and brain healthy . Avoid tobacco products . Limit  alcohol to  7 per week for women and 14 servings for men.  Get adequate sleep . Wear seat belts . Don't text and drive .   Plan   Fasting bg  hgba1c  In 4-6 months . OV depending on results  Consider seeing dietician  If needed.     Food Choices to Lower Your Triglycerides  Triglycerides are a type of fat in your blood. High levels of triglycerides can increase the risk of heart disease and stroke. If your triglyceride levels are high, the foods you eat and your eating habits are very important. Choosing the right foods can help lower your triglycerides.  WHAT GENERAL GUIDELINES DO I NEED TO FOLLOW?  Lose weight if you are overweight.   Limit or avoid alcohol.   Fill one half of your plate  with vegetables and green salads.   Limit fruit to two servings a day. Choose fruit instead of juice.   Make one fourth of your plate whole grains. Look for the word "whole" as the first word in the ingredient list.  Fill one fourth of your plate with lean protein foods.  Enjoy fatty fish (such as salmon, mackerel, sardines, and tuna) three times a week.   Choose healthy fats.   Limit foods high in starch and sugar.  Eat more home-cooked food and less restaurant, buffet, and fast food.  Limit fried foods.  Cook foods using methods other than frying.  Limit saturated fats.  Check ingredient lists to avoid foods with partially hydrogenated oils (trans fats) in them. WHAT FOODS CAN I EAT?  Grains Whole grains, such as whole wheat or whole grain breads, crackers, cereals, and pasta. Unsweetened oatmeal, bulgur, barley, quinoa, or brown rice. Corn or whole wheat flour tortillas.  Vegetables Fresh or frozen vegetables (raw, steamed, roasted, or grilled). Green salads. Fruits All fresh, canned (in natural juice), or frozen fruits. Meat and Other Protein Products Ground beef (85% or leaner), grass-fed beef, or beef trimmed of fat. Skinless chicken or [place]. Ground chicken or [place]. Pork trimmed of fat. All fish and seafood. Eggs. Dried beans, peas, or lentils. Unsalted nuts or seeds. Unsalted canned or dry beans. Dairy Low-fat dairy products, such as skim or 1% milk, 2% or reduced-fat cheeses, low-fat ricotta or cottage cheese, or plain low-fat yogurt. Fats and Oils Tub margarines without trans fats. Light or reduced-fat mayonnaise and salad dressings. Avocado. Safflower, olive, or canola oils. Natural peanut or almond butter. The items listed above may not be a complete list of recommended foods or beverages. Contact your dietitian for more options. WHAT FOODS ARE NOT RECOMMENDED?  Grains White bread. White pasta. White rice. Cornbread. Bagels, pastries, and croissants. Crackers  that contain trans fat. Vegetables White potatoes. Corn. Creamed or fried vegetables. Vegetables in a cheese sauce. Fruits Dried fruits. Canned fruit in light or heavy syrup. Fruit  juice. Meat and Other Protein Products Fatty cuts of meat. Ribs, chicken wings, bacon, sausage, bologna, salami, chitterlings, fatback, hot dogs, bratwurst, and packaged luncheon meats. Dairy Whole or 2% milk, cream, half-and-half, and cream cheese. Whole-fat or sweetened yogurt. Full-fat cheeses. Nondairy creamers and whipped toppings. Processed cheese, cheese spreads, or cheese curds. Sweets and Desserts Corn syrup, sugars, honey, and molasses. Candy. Jam and jelly. Syrup. Sweetened cereals. Cookies, pies, cakes, donuts, muffins, and ice cream. Fats and Oils Butter, stick margarine, lard, shortening, ghee, or bacon fat. Coconut, palm kernel, or palm oils. Beverages Alcohol. Sweetened drinks (such as sodas, lemonade, and fruit drinks or punches). The items listed above may not be a complete list of foods and beverages to avoid. Contact your dietitian for more information. Document Released: 01/11/2004 Document Revised: 03/30/2013 Document Reviewed: 01/27/2013 [facility] Patient Information 2015 [place], [place]. This information is not intended to replace advice given to you by your health care provider. Make sure you discuss any questions you have with your health care provider.     Standley Brooking. Cathyrn Deas M.D.

## 2014-01-31 NOTE — Assessment & Plan Note (Signed)
Yearly follow up at [facility]

## 2014-06-18 DIAGNOSIS — N8003 Adenomyosis of the uterus: Secondary | ICD-10-CM | POA: Insufficient documentation

## 2014-06-18 DIAGNOSIS — N809 Endometriosis, unspecified: Secondary | ICD-10-CM

## 2014-06-18 DIAGNOSIS — N800 Endometriosis of uterus: Secondary | ICD-10-CM | POA: Insufficient documentation

## 2014-08-03 ENCOUNTER — Other Ambulatory Visit (INDEPENDENT_AMBULATORY_CARE_PROVIDER_SITE_OTHER): Payer: BLUE CROSS/BLUE SHIELD

## 2014-08-03 DIAGNOSIS — E119 Type 2 diabetes mellitus without complications: Secondary | ICD-10-CM

## 2014-08-03 LAB — POCT CBG (FASTING - GLUCOSE)-MANUAL ENTRY: Glucose Fasting, POC: 114 mg/dL — AB (ref 70–99)

## 2014-08-03 LAB — HEMOGLOBIN A1C: HEMOGLOBIN A1C: 5.6 % (ref 4.6–6.5)

## 2014-08-04 ENCOUNTER — Other Ambulatory Visit: Payer: Self-pay | Admitting: Family Medicine

## 2014-08-04 DIAGNOSIS — R739 Hyperglycemia, unspecified: Secondary | ICD-10-CM

## 2015-01-30 ENCOUNTER — Other Ambulatory Visit (INDEPENDENT_AMBULATORY_CARE_PROVIDER_SITE_OTHER): Payer: BLUE CROSS/BLUE SHIELD

## 2015-01-30 DIAGNOSIS — R739 Hyperglycemia, unspecified: Secondary | ICD-10-CM

## 2015-01-30 DIAGNOSIS — Z0000 Encounter for general adult medical examination without abnormal findings: Secondary | ICD-10-CM

## 2015-01-30 LAB — CBC WITH DIFFERENTIAL/PLATELET
Basophils Absolute: 0 K/uL (ref 0.0–0.1)
Basophils Relative: 0.6 % (ref 0.0–3.0)
Eosinophils Absolute: 0.3 K/uL (ref 0.0–0.7)
Eosinophils Relative: 4.4 % (ref 0.0–5.0)
HCT: 40.8 % (ref 36.0–46.0)
Hemoglobin: 13.4 g/dL (ref 12.0–15.0)
Lymphocytes Relative: 23.3 % (ref 12.0–46.0)
Lymphs Abs: 1.4 K/uL (ref 0.7–4.0)
MCHC: 32.9 g/dL (ref 30.0–36.0)
MCV: 92.6 fl (ref 78.0–100.0)
Monocytes Absolute: 0.4 K/uL (ref 0.1–1.0)
Monocytes Relative: 7.2 % (ref 3.0–12.0)
Neutro Abs: 4 K/uL (ref 1.4–7.7)
Neutrophils Relative %: 64.5 % (ref 43.0–77.0)
Platelets: 287 K/uL (ref 150.0–400.0)
RBC: 4.41 Mil/uL (ref 3.87–5.11)
RDW: 12.6 % (ref 11.5–15.5)
WBC: 6.1 K/uL (ref 4.0–10.5)

## 2015-01-30 LAB — BASIC METABOLIC PANEL
BUN: 14 mg/dL (ref 6–23)
CHLORIDE: 104 meq/L (ref 96–112)
CO2: 28 mEq/L (ref 19–32)
Calcium: 9.3 mg/dL (ref 8.4–10.5)
Creatinine, Ser: 0.61 mg/dL (ref 0.40–1.20)
GFR: 110.39 mL/min (ref >60.00)
Glucose, Bld: 116 mg/dL — ABNORMAL HIGH (ref 70–99)
POTASSIUM: 4.7 meq/L (ref 3.5–5.1)
SODIUM: 140 meq/L (ref 135–145)

## 2015-01-30 LAB — HEPATIC FUNCTION PANEL
ALT: 17 U/L (ref 0–35)
AST: 15 U/L (ref 0–37)
Albumin: 4.2 g/dL (ref 3.5–5.2)
Alkaline Phosphatase: 60 U/L (ref 39–117)
BILIRUBIN TOTAL: 0.3 mg/dL (ref 0.2–1.2)
Bilirubin, Direct: 0 mg/dL (ref 0.0–0.3)
TOTAL PROTEIN: 7.5 g/dL (ref 6.0–8.3)

## 2015-01-30 LAB — LIPID PANEL
Cholesterol: 237 mg/dL — ABNORMAL HIGH (ref 0–200)
HDL: 49.4 mg/dL
LDL Cholesterol: 150 mg/dL — ABNORMAL HIGH (ref 0–99)
NonHDL: 187.62
Total CHOL/HDL Ratio: 5
Triglycerides: 189 mg/dL — ABNORMAL HIGH (ref 0.0–149.0)
VLDL: 37.8 mg/dL (ref 0.0–40.0)

## 2015-01-30 LAB — TSH: TSH: 0.92 u[IU]/mL (ref 0.35–4.50)

## 2015-01-30 LAB — HEMOGLOBIN A1C: HEMOGLOBIN A1C: 5.9 % (ref 4.6–6.5)

## 2015-02-03 ENCOUNTER — Ambulatory Visit (INDEPENDENT_AMBULATORY_CARE_PROVIDER_SITE_OTHER): Payer: BLUE CROSS/BLUE SHIELD | Admitting: Internal Medicine

## 2015-02-03 ENCOUNTER — Encounter: Payer: Self-pay | Admitting: Internal Medicine

## 2015-02-03 VITALS — BP 124/80 | HR 92 | Temp 98.2°F | Ht 61.5 in | Wt 130.0 lb

## 2015-02-03 DIAGNOSIS — Z853 Personal history of malignant neoplasm of breast: Secondary | ICD-10-CM

## 2015-02-03 DIAGNOSIS — Z0000 Encounter for general adult medical examination without abnormal findings: Secondary | ICD-10-CM

## 2015-02-03 DIAGNOSIS — L309 Dermatitis, unspecified: Secondary | ICD-10-CM

## 2015-02-03 DIAGNOSIS — E049 Nontoxic goiter, unspecified: Secondary | ICD-10-CM

## 2015-02-03 DIAGNOSIS — E785 Hyperlipidemia, unspecified: Secondary | ICD-10-CM

## 2015-02-03 DIAGNOSIS — R0982 Postnasal drip: Secondary | ICD-10-CM

## 2015-02-03 DIAGNOSIS — R7301 Impaired fasting glucose: Secondary | ICD-10-CM

## 2015-02-03 MED ORDER — BETAMETHASONE DIPROPIONATE 0.05 % EX CREA: TOPICAL_CREAM | Freq: Two times a day (BID) | CUTANEOUS | Status: DC

## 2015-02-03 NOTE — Patient Instructions (Signed)
Intensify lifestyle interventions. No diabetes but at risk   Life style that helps blood sugar should help cholesterol levels.  Let us know if  You want to see a nutritionist. ascvd 10 year risk 1.6 %  May underestimate somewhat.   You have a goiter  Not alarming . But needs follow  Up.   May need more labs tests  And poss ultrasound of neck    We can do endocrinology referral .   Add flonase or nasacort otc 2 sprays every day each nostril  For at least 10 days to 2 weeks to see if helps the  Post nasal drainage  Issues . If so can remain on it.   Get colonscopy of colon cancer screening next year can call for referral   Health Maintenance, Female Adopting a healthy lifestyle and getting preventive care can go a long way to promote health and wellness. Talk with your health care provider about what schedule of regular examinations is right for you. This is a good chance for you to check in with your provider about disease prevention and staying healthy. In between checkups, there are plenty of things you can do on your own. Experts have done a lot of research about which lifestyle changes and preventive measures are most likely to keep you healthy. Ask your health care provider for more information. WEIGHT AND DIET  Eat a healthy diet  Be sure to include plenty of vegetables, fruits, low-fat dairy products, and lean protein.  Do not eat a lot of foods high in solid fats, added sugars, or salt.  Get regular exercise. This is one of the most important things you can do for your health.  Most adults should exercise for at least 150 minutes each week. The exercise should increase your heart rate and make you sweat (moderate-intensity exercise).  Most adults should also do strengthening exercises at least twice a week. This is in addition to the moderate-intensity exercise.  Maintain a healthy weight  Body mass index (BMI) is a measurement that can be used to identify possible weight  problems. It estimates body fat based on height and weight. Your health care provider can help determine your BMI and help you achieve or maintain a healthy weight.  For females 8 years of age and older:   A BMI below 18.5 is considered underweight.  A BMI of 18.5 to 24.9 is normal.  A BMI of 25 to 29.9 is considered overweight.  A BMI of 30 and above is considered obese.  Watch levels of cholesterol and blood lipids  You should start having your blood tested for lipids and cholesterol at 50 years of age, then have this test every 5 years.  You may need to have your cholesterol levels checked more often if:  Your lipid or cholesterol levels are high.  You are older than 50 years of age.  You are at high risk for heart disease.  CANCER SCREENING   Lung Cancer  Lung cancer screening is recommended for adults 73-50 years old who are at high risk for lung cancer because of a history of smoking.  A yearly low-dose CT scan of the lungs is recommended for people who:  Currently smoke.  Have quit within the past 50 years.  Have at least a 30-pack-year history of smoking. A pack year is smoking an average of one pack of cigarettes a day for 1 year.  Yearly screening should continue until it has been 15 years since  you quit.  Yearly screening should stop if you develop a health problem that would prevent you from having lung cancer treatment.  Breast Cancer  Practice breast self-awareness. This means understanding how your breasts normally appear and feel.  It also means doing regular breast self-exams. Let your health care provider know about any changes, no matter how small.  If you are in your 50s or 30s, you should have a clinical breast exam (CBE) by a health care provider every 1-3 years as part of a regular health exam.  If you are 50 or older, have a CBE every year. Also consider having a breast X-ray (mammogram) every year.  If you have a family history of breast  cancer, talk to your health care provider about genetic screening.  If you are at high risk for breast cancer, talk to your health care provider about having an MRI and a mammogram every year.  Breast cancer gene (BRCA) assessment is recommended for women who have family members with BRCA-related cancers. BRCA-related cancers include:  Breast.  Ovarian.  Tubal.  Peritoneal cancers.  Results of the assessment will determine the need for genetic counseling and BRCA1 and BRCA2 testing. Cervical Cancer Your health care provider may recommend that you be screened regularly for cancer of the pelvic organs (ovaries, uterus, and vagina). This screening involves a pelvic examination, including checking for microscopic changes to the surface of your cervix (Pap test). You may be encouraged to have this screening done every 3 years, beginning at age 22.  For women ages 50-65, health care providers may recommend pelvic exams and Pap testing every 3 years, or they may recommend the Pap and pelvic exam, combined with testing for human papilloma virus (HPV), every 5 years. Some types of HPV increase your risk of cervical cancer. Testing for HPV may also be done on women of any age with unclear Pap test results.  Other health care providers may not recommend any screening for nonpregnant women who are considered low risk for pelvic cancer and who do not have symptoms. Ask your health care provider if a screening pelvic exam is right for you.  If you have had past treatment for cervical cancer or a condition that could lead to cancer, you need Pap tests and screening for cancer for at least 20 years after your treatment. If Pap tests have been discontinued, your risk factors (such as having a new sexual partner) need to be reassessed to determine if screening should resume. Some women have medical problems that increase the chance of getting cervical cancer. In these cases, your health care provider may  recommend more frequent screening and Pap tests. Colorectal Cancer  This type of cancer can be detected and often prevented.  Routine colorectal cancer screening usually begins at 50 years of age and continues through 50 years of age.  Your health care provider may recommend screening at an earlier age if you have risk factors for colon cancer.  Your health care provider may also recommend using home test kits to check for hidden blood in the stool.  A small camera at the end of a tube can be used to examine your colon directly (sigmoidoscopy or colonoscopy). This is done to check for the earliest forms of colorectal cancer.  Routine screening usually begins at age 22.  Direct examination of the colon should be repeated every 5-10 years through 50 years of age. However, you may need to be screened more often if early forms of  precancerous polyps or small growths are found. Skin Cancer  Check your skin from head to toe regularly.  Tell your health care provider about any new moles or changes in moles, especially if there is a change in a mole's shape or color.  Also tell your health care provider if you have a mole that is larger than the size of a pencil eraser.  Always use sunscreen. Apply sunscreen liberally and repeatedly throughout the day.  Protect yourself by wearing long sleeves, pants, a wide-brimmed hat, and sunglasses whenever you are outside. HEART DISEASE, DIABETES, AND HIGH BLOOD PRESSURE   High blood pressure causes heart disease and increases the risk of stroke. High blood pressure is more likely to develop in:  People who have blood pressure in the high end of the normal range (130-139/85-89 mm Hg).  People who are overweight or obese.  People who are African American.  If you are 32-30 years of age, have your blood pressure checked every 3-5 years. If you are 25 years of age or older, have your blood pressure checked every year. You should have your blood  pressure measured twice--once when you are at a hospital or clinic, and once when you are not at a hospital or clinic. Record the average of the two measurements. To check your blood pressure when you are not at a hospital or clinic, you can use:  An automated blood pressure machine at a pharmacy.  A home blood pressure monitor.  If you are between 48 years and 27 years old, ask your health care provider if you should take aspirin to prevent strokes.  Have regular diabetes screenings. This involves taking a blood sample to check your fasting blood sugar level.  If you are at a normal weight and have a low risk for diabetes, have this test once every three years after 50 years of age.  If you are overweight and have a high risk for diabetes, consider being tested at a younger age or more often. PREVENTING INFECTION  Hepatitis B  If you have a higher risk for hepatitis B, you should be screened for this virus. You are considered at high risk for hepatitis B if:  You were born in a country where hepatitis B is common. Ask your health care provider which countries are considered high risk.  Your parents were born in a high-risk country, and you have not been immunized against hepatitis B (hepatitis B vaccine).  You have HIV or AIDS.  You use needles to inject street drugs.  You live with someone who has hepatitis B.  You have had sex with someone who has hepatitis B.  You get hemodialysis treatment.  You take certain medicines for conditions, including cancer, organ transplantation, and autoimmune conditions. Hepatitis C  Blood testing is recommended for:  Everyone born from [birth year] through 1966.  Anyone with known risk factors for hepatitis C. Sexually transmitted infections (STIs)  You should be screened for sexually transmitted infections (STIs) including gonorrhea and chlamydia if:  You are sexually active and are younger than 50 years of age.  You are older than 50 years  of age and your health care provider tells you that you are at risk for this type of infection.  Your sexual activity has changed since you were last screened and you are at an increased risk for chlamydia or gonorrhea. Ask your health care provider if you are at risk.  If you do not have HIV, but are at risk,  it may be recommended that you take a prescription medicine daily to prevent HIV infection. This is called pre-exposure prophylaxis (PrEP). You are considered at risk if:  You are sexually active and do not regularly use condoms or know the HIV status of your partner(s).  You take drugs by injection.  You are sexually active with a partner who has HIV. Talk with your health care provider about whether you are at high risk of being infected with HIV. If you choose to begin PrEP, you should first be tested for HIV. You should then be tested every 3 months for as long as you are taking PrEP.  PREGNANCY   If you are premenopausal and you may become pregnant, ask your health care provider about preconception counseling.  If you may become pregnant, take 400 to 800 micrograms (mcg) of folic acid every day.  If you want to prevent pregnancy, talk to your health care provider about birth control (contraception). OSTEOPOROSIS AND MENOPAUSE   Osteoporosis is a disease in which the bones lose minerals and strength with aging. This can result in serious bone fractures. Your risk for osteoporosis can be identified using a bone density scan.  If you are 7 years of age or older, or if you are at risk for osteoporosis and fractures, ask your health care provider if you should be screened.  Ask your health care provider whether you should take a calcium or vitamin D supplement to lower your risk for osteoporosis.  Menopause may have certain physical symptoms and risks.  Hormone replacement therapy may reduce some of these symptoms and risks. Talk to your health care provider about whether hormone  replacement therapy is right for you.  HOME CARE INSTRUCTIONS   Schedule regular health, dental, and eye exams.  Stay current with your immunizations.   Do not use any tobacco products including cigarettes, chewing tobacco, or electronic cigarettes.  If you are pregnant, do not drink alcohol.  If you are breastfeeding, limit how much and how often you drink alcohol.  Limit alcohol intake to no more than 1 drink per day for nonpregnant women. One drink equals 12 ounces of beer, 5 ounces of wine, or 1 ounces of hard liquor.  Do not use street drugs.  Do not share needles.  Ask your health care provider for help if you need support or information about quitting drugs.  Tell your health care provider if you often feel depressed.  Tell your health care provider if you have ever been abused or do not feel safe at home.   This information is not intended to replace advice given to you by your health care provider. Make sure you discuss any questions you have with your health care provider.   Document Released: 10/08/2010 Document Revised: 04/15/2014 Document Reviewed: 02/24/2013 Elsevier Interactive Patient Education [employer].

## 2015-02-03 NOTE — Progress Notes (Signed)
Pre visit review using our clinic review tool, if applicable. No additional management support is needed unless otherwise documented below in the visit note. 

## 2015-02-03 NOTE — Progress Notes (Signed)
Chief Complaint  Patient presents with  . Annual Exam    HPI: Patient  Melissa Brown  49 y.o. comes in today for Preventive Health Care visit  S/p breast cancer yearly mammo , hx gest dm not exercising as much  Has some PND nasal congestion  Without fever   Feels like drainage in throat  No sob wheezing dysphagia  Heart burn  Asks a bout shingles vaccine at 49 Ask for cs cream for patch  On neck used in past  Health Maintenance  Topic Date Due  . PNEUMOCOCCAL POLYSACCHARIDE VACCINE (1) 03/09/1967  . FOOT EXAM  03/09/1975  . OPHTHALMOLOGY EXAM  03/09/1975  . HIV Screening  03/08/1980  . URINE MICROALBUMIN  01/15/2014  . PAP SMEAR  03/10/2015  . HEMOGLOBIN A1C  07/31/2015  . INFLUENZA VACCINE  11/07/2015  . TETANUS/TDAP  07/07/2016   Health Maintenance Review LIFESTYLE:  Exercise:  Not recenetly  Tobacco/ETS:n Alcohol:  ocass Sugar beverages:n Sleep:7- 7.5 Drug use: no    ROS:  GEN/ HEENT: No fever, significant weight changes sweats headaches vision problems hearing changes, CV/ PULM; No chest pain shortness of breath, syncope,edema  change in exercise tolerance. GI /GU: No adominal pain, vomiting, change in bowel habits. No blood in the stool. No significant GU symptoms. SKIN/HEME: ,no acute skin rashes suspicious lesions or bleeding. No lymphadenopathy, nodules, masses.  NEURO/ PSYCH:  No neurologic signs such as weakness numbness. No depression anxiety. IMM/ Allergy: No unusual infections.  Allergy . ? See above   REST of 12 system review negative except as per HPI   Past Medical History  Diagnosis Date  . Breast cancer ([place]) 2005    left dcis  . Diabetes, gestational   . Scoliosis   . 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    Doristine Counter DIABETES 02/09/2007    Qualifier: History of  By: Regis Bill MD, Standley Brooking   . Allergic     to grass and moldsby skin testing   . Palpitations     tachy, supressed with atenolol    . MVP (mitral valve prolapse)     ANTERIOR LEAFLET, MILD MR  . Breast cancer ([place])   . Thyroid disease     HYPERTHYROIDISM    Past Surgical History  Procedure Laterality Date  . Breast lumpectomy      radiation DCIS- reconstruction left  . Cesarean section      Family History  Problem Relation Age of Onset  . Diabetes Mother   . Thyroid disease Mother   . Hypertension Mother   . Diabetes Father   . Heart disease Father   . Diabetes Other     Social History   Social History  . Marital Status: Married    Spouse Name: N/A  . Number of Children: N/A  . Years of Education: N/A   Social History Main Topics  . Smoking status: Never Smoker   . Smokeless tobacco: None  . Alcohol Use: 0.5 oz/week    1 drink(s) per week  . Drug Use: No  . Sexual Activity: Not Asked   Other Topics Concern  . 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 Cholecalciferol (VITAMIN D-3 PO) Take by mouth.    . ferrous sulfate 325 (65 FE) MG tablet Take 325 mg by mouth daily with breakfast. During period    . Multiple Vitamins-Calcium (ONE-A-DAY WOMENS PO) Take by mouth daily.      . betamethasone dipropionate (DIPROLENE) 0.05 % cream Apply topically 2 (two) times daily. To affected area; not on face 15 g 2   No facility-administered medications prior to visit.     EXAM:  BP 124/80 mmHg  Pulse 92  Temp(Src) 98.2 F (36.8 C) (Oral)  Ht 5' 1.5" (1.562 m)  Wt 130 lb (58.968 kg)  BMI 24.17 kg/m2  SpO2 96%  LMP 12/12/2014  Body mass index is 24.17 kg/(m^2).  Physical Exam: Vital signs reviewed LOV:FIEP is a well-developed well-nourished alert cooperative    who appearsr stated age in no acute distress. Mildly  congested  HEENT: normocephalic atraumatic , Eyes: PERRL EOM's full, conjunctiva clear, Nares: paten,t no deformity discharge or tenderness stuffy ., Ears: no deformity EAC's clear TMs with normal landmarks. Mouth: clear OP, no lesions, edema.  Moist mucous membranes. Dentition in adequate repair. NECK: supple without tor bruits. Goiter ?   No nodules predominant  isthmus CHEST/PULM:  Clear to auscultation and percussion breath sounds equal no wheeze , rales or rhonchi. No chest wall deformities or tenderness. Breast  Left reconst right no masses  Axilla clear  CV: PMI is nondisplaced, S1 S2 no gallops, murmurs, rubs. ocass click mid systolic  Peripheral pulses are full without delay.No JVD .  ABDOMEN: Bowel sounds normal nontender  No guard or rebound, no hepato splenomegal no CVA tenderness. Extremtities:  No clubbing cyanosis or edema, no acute joint swelling or redness no focal atrophy NEURO:  Oriented x3, cranial nerves 3-12 appear to be intact, no obvious focal weakness,gait within normal limits no abnormal reflexes or asymmetrical SKIN: No acute rashes normal turgor, color, no bruising or petechiae. Right neck small eczema like patch  PSYCH: Oriented, good eye contact, no obvious depression anxiety, cognition and judgment appear normal. LN: no cervical axillary inguinal adenopathy  Lab Results  Component Value Date   WBC 6.1 01/30/2015   HGB 13.4 01/30/2015   HCT 40.8 01/30/2015   PLT 287.0 01/30/2015   GLUCOSE 116* 01/30/2015   CHOL 237* 01/30/2015   TRIG 189.0* 01/30/2015   HDL 49.40 01/30/2015   LDLDIRECT 163.2 01/17/2012   LDLCALC 150* 01/30/2015   ALT 17 01/30/2015   AST 15 01/30/2015   NA 140 01/30/2015   K 4.7 01/30/2015   CL 104 01/30/2015   CREATININE 0.61 01/30/2015   BUN 14 01/30/2015   CO2 28 01/30/2015   TSH 0.92 01/30/2015   HGBA1C 5.9 01/30/2015   MICROALBUR 0.5 01/15/2013    ASSESSMENT AND PLAN:  Discussed the following assessment and plan:  Visit for  preventive health examination - will call about colonoscopy referral when due  Goiter - fam hx thyroidsurgery tsh nl dis exp m further eval she will contact [place] bout referral after investigating  Fasting hyperglycemia - add exercise follow   Hyperlipidemia - reviewed lsi will contact us if wishes nutrition referral  Post-nasal drip - trial flonase or nasacort    BREAST CANCER, HX OF  Dermatitis - mild prob eczema refill topical with precautions  Patient Care Team: Burnis Medin, MD  as PCP - General Dorien Chihuahua, MD as Referring Physician (Obstetrics and Gynecology) liu debra  Harvie Heck, MD as Consulting Physician (Internal Medicine) Patient Instructions  Intensify lifestyle interventions. No diabetes but at risk   Life style that helps blood sugar should help cholesterol levels.  Let us know if  You want to see a nutritionist. ascvd 10 year risk 1.6 %  May underestimate somewhat.   You have a goiter  Not alarming . But needs follow  Up.   May need more labs tests  And poss ultrasound of neck    We can do endocrinology referral .   Add flonase or nasacort otc 2 sprays every day each nostril  For at least 10 days to 2 weeks to see if helps the  Post nasal drainage  Issues . If so can remain on it.   Get colonscopy of colon cancer screening next year can call for referral   Health Maintenance, Female Adopting a healthy lifestyle and getting preventive care can go a long way to promote health and wellness. Talk with your health care provider about what schedule of regular examinations is right for you. This is a good chance for you to check in with your provider about disease prevention and staying healthy. In between checkups, there are plenty of things you can do on your own. Experts have done a lot of research about which lifestyle changes and preventive measures are most likely to keep you healthy. Ask your health care provider for more information. WEIGHT AND DIET  Eat a  healthy diet  Be sure to include plenty of vegetables, fruits, low-fat dairy products, and lean protein.  Do not eat a lot of foods high in solid fats, added sugars, or salt.  Get regular exercise. This is one of the most important things you can do for your health.  Most adults should exercise for at least 150 minutes each week. The exercise should increase your heart rate and make you sweat (moderate-intensity exercise).  Most adults should also do strengthening exercises at least twice a week. This is in addition to the moderate-intensity exercise.  Maintain a healthy weight  Body mass index (BMI) is a measurement that can be used to identify possible weight problems. It estimates body fat based on height and weight. Your health care provider can help determine your BMI and help you achieve or maintain a healthy weight.  For females 48 years of age and older:   A BMI below 18.5 is considered underweight.  A BMI of 18.5 to 24.9 is normal.  A BMI of 25 to 29.9 is considered overweight.  A BMI of 30 and above is considered obese.  Watch levels of cholesterol and blood lipids  You should start having your blood tested for lipids and cholesterol at 50 years of age, then have this test every 5 years.  You may need to have your cholesterol levels checked more often if:  Your lipid or cholesterol levels are high.  You are older than 50 years of age.  You are at high risk for heart disease.  CANCER SCREENING   Lung Cancer  Lung cancer screening is recommended for adults 40-19 years old who are at high risk for lung cancer because of a history of smoking.  A yearly low-dose CT scan of the lungs is recommended for people who:  Currently smoke.  Have quit within the past 15 years.  Have at least a 30-pack-year history of smoking. A pack  year is smoking an average of one pack of cigarettes a day for 1 year.  Yearly screening should continue until it has been 15 years since  you quit.  Yearly screening should stop if you develop a health problem that would prevent you from having lung cancer treatment.  Breast Cancer  Practice breast self-awareness. This means understanding how your breasts normally appear and feel.  It also means doing regular breast self-exams. Let your health care provider know about any changes, no matter how small.  If you are in your 20s or 30s, you should have a clinical breast exam (CBE) by a health care provider every 1-3 years as part of a regular health exam.  If you are 43 or older, have a CBE every year. Also consider having a breast X-ray (mammogram) every year.  If you have a family history of breast cancer, talk to your health care provider about genetic screening.  If you are at high risk for breast cancer, talk to your health care provider about having an MRI and a mammogram every year.  Breast cancer gene (BRCA) assessment is recommended for women who have family members with BRCA-related cancers. BRCA-related cancers include:  Breast.  Ovarian.  Tubal.  Peritoneal cancers.  Results of the assessment will determine the need for genetic counseling and BRCA1 and BRCA2 testing. Cervical Cancer Your health care provider may recommend that you be screened regularly for cancer of the pelvic organs (ovaries, uterus, and vagina). This screening involves a pelvic examination, including checking for microscopic changes to the surface of your cervix (Pap test). You may be encouraged to have this screening done every 3 years, beginning at age 51.  For women ages 26-65, health care providers may recommend pelvic exams and Pap testing every 3 years, or they may recommend the Pap and pelvic exam, combined with testing for human papilloma virus (HPV), every 5 years. Some types of HPV increase your risk of cervical cancer. Testing for HPV may also be done on women of any age with unclear Pap test results.  Other health care providers  may not recommend any screening for nonpregnant women who are considered low risk for pelvic cancer and who do not have symptoms. Ask your health care provider if a screening pelvic exam is right for you.  If you have had past treatment for cervical cancer or a condition that could lead to cancer, you need Pap tests and screening for cancer for at least 20 years after your treatment. If Pap tests have been discontinued, your risk factors (such as having a new sexual partner) need to be reassessed to determine if screening should resume. Some women have medical problems that increase the chance of getting cervical cancer. In these cases, your health care provider may recommend more frequent screening and Pap tests. Colorectal Cancer  This type of cancer can be detected and often prevented.  Routine colorectal cancer screening usually begins at 50 years of age and continues through 50 years of age.  Your health care provider may recommend screening at an earlier age if you have risk factors for colon cancer.  Your health care provider may also recommend using home test kits to check for hidden blood in the stool.  A small camera at the end of a tube can be used to examine your colon directly (sigmoidoscopy or colonoscopy). This is done to check for the earliest forms of colorectal cancer.  Routine screening usually begins at age 34.  Direct examination  of the colon should be repeated every 5-10 years through 50 years of age. However, you may need to be screened more often if early forms of precancerous polyps or small growths are found. Skin Cancer  Check your skin from head to toe regularly.  Tell your health care provider about any new moles or changes in moles, especially if there is a change in a mole's shape or color.  Also tell your health care provider if you have a mole that is larger than the size of a pencil eraser.  Always use sunscreen. Apply sunscreen liberally and repeatedly  throughout the day.  Protect yourself by wearing long sleeves, pants, a wide-brimmed hat, and sunglasses whenever you are outside. HEART DISEASE, DIABETES, AND HIGH BLOOD PRESSURE   High blood pressure causes heart disease and increases the risk of stroke. High blood pressure is more likely to develop in:  People who have blood pressure in the high end of the normal range (130-139/85-89 mm Hg).  People who are overweight or obese.  People who are African American.  If you are 1-12 years of age, have your blood pressure checked every 3-5 years. If you are 10 years of age or older, have your blood pressure checked every year. You should have your blood pressure measured twice--once when you are at a hospital or clinic, and once when you are not at a hospital or clinic. Record the average of the two measurements. To check your blood pressure when you are not at a hospital or clinic, you can use:  An automated blood pressure machine at a pharmacy.  A home blood pressure monitor.  If you are between 34 years and 50 years old, ask your health care provider if you should take aspirin to prevent strokes.  Have regular diabetes screenings. This involves taking a blood sample to check your fasting blood sugar level.  If you are at a normal weight and have a low risk for diabetes, have this test once every three years after 50 years of age.  If you are overweight and have a high risk for diabetes, consider being tested at a younger age or more often. PREVENTING INFECTION  Hepatitis B  If you have a higher risk for hepatitis B, you should be screened for this virus. You are considered at high risk for hepatitis B if:  You were born in a country where hepatitis B is common. Ask your health care provider which countries are considered high risk.  Your parents were born in a high-risk country, and you have not been immunized against hepatitis B (hepatitis B vaccine).  You have HIV or  AIDS.  You use needles to inject street drugs.  You live with someone who has hepatitis B.  You have had sex with someone who has hepatitis B.  You get hemodialysis treatment.  You take certain medicines for conditions, including cancer, organ transplantation, and autoimmune conditions. Hepatitis C  Blood testing is recommended for:  Everyone born from [birth year] through 1966.  Anyone with known risk factors for hepatitis C. Sexually transmitted infections (STIs)  You should be screened for sexually transmitted infections (STIs) including gonorrhea and chlamydia if:  You are sexually active and are younger than 50 years of age.  You are older than 50 years of age and your health care provider tells you that you are at risk for this type of infection.  Your sexual activity has changed since you were last screened and you are at an  increased risk for chlamydia or gonorrhea. Ask your health care provider if you are at risk.  If you do not have HIV, but are at risk, it may be recommended that you take a prescription medicine daily to prevent HIV infection. This is called pre-exposure prophylaxis (PrEP). You are considered at risk if:  You are sexually active and do not regularly use condoms or know the HIV status of your partner(s).  You take drugs by injection.  You are sexually active with a partner who has HIV. Talk with your health care provider about whether you are at high risk of being infected with HIV. If you choose to begin PrEP, you should first be tested for HIV. You should then be tested every 3 months for as long as you are taking PrEP.  PREGNANCY   If you are premenopausal and you may become pregnant, ask your health care provider about preconception counseling.  If you may become pregnant, take 400 to 800 micrograms (mcg) of folic acid every day.  If you want to prevent pregnancy, talk to your health care provider about birth control (contraception). OSTEOPOROSIS AND  MENOPAUSE   Osteoporosis is a disease in which the bones lose minerals and strength with aging. This can result in serious bone fractures. Your risk for osteoporosis can be identified using a bone density scan.  If you are 22 years of age or older, or if you are at risk for osteoporosis and fractures, ask your health care provider if you should be screened.  Ask your health care provider whether you should take a calcium or vitamin D supplement to lower your risk for osteoporosis.  Menopause may have certain physical symptoms and risks.  Hormone replacement therapy may reduce some of these symptoms and risks. Talk to your health care provider about whether hormone replacement therapy is right for you.  HOME CARE INSTRUCTIONS   Schedule regular health, dental, and eye exams.  Stay current with your immunizations.   Do not use any tobacco products including cigarettes, chewing tobacco, or electronic cigarettes.  If you are pregnant, do not drink alcohol.  If you are breastfeeding, limit how much and how often you drink alcohol.  Limit alcohol intake to no more than 1 drink per day for nonpregnant women. One drink equals 12 ounces of beer, 5 ounces of wine, or 1 ounces of hard liquor.  Do not use street drugs.  Do not share needles.  Ask your health care provider for help if you need support or information about quitting drugs.  Tell your health care provider if you often feel depressed.  Tell your health care provider if you have ever been abused or do not feel safe at home.   This information is not intended to replace advice given to you by your health care provider. Make sure you discuss any questions you have with your health care provider.   Document Released: 10/08/2010 Document Revised: 04/15/2014 Document Reviewed: 02/24/2013 Elsevier Interactive Patient Education 2016 Donald K. Panosh M.D.

## 2015-02-04 ENCOUNTER — Encounter: Payer: Self-pay | Admitting: Internal Medicine

## 2015-02-04 DIAGNOSIS — R0982 Postnasal drip: Secondary | ICD-10-CM | POA: Insufficient documentation

## 2015-02-04 DIAGNOSIS — E049 Nontoxic goiter, unspecified: Secondary | ICD-10-CM | POA: Insufficient documentation

## 2015-02-12 ENCOUNTER — Encounter: Payer: Self-pay | Admitting: Internal Medicine

## 2015-02-12 DIAGNOSIS — E049 Nontoxic goiter, unspecified: Secondary | ICD-10-CM

## 2015-02-14 NOTE — Telephone Encounter (Signed)
Misty I will put in referral but can you contact her about this  Please send copies of last visit and 3 years of blood and thyroid tests ,

## 2015-04-20 ENCOUNTER — Ambulatory Visit (INDEPENDENT_AMBULATORY_CARE_PROVIDER_SITE_OTHER): Payer: BLUE CROSS/BLUE SHIELD | Admitting: Internal Medicine

## 2015-04-20 ENCOUNTER — Encounter: Payer: Self-pay | Admitting: Internal Medicine

## 2015-04-20 VITALS — BP 120/84 | HR 107 | Temp 98.5°F | Wt 127.1 lb

## 2015-04-20 DIAGNOSIS — J069 Acute upper respiratory infection, unspecified: Secondary | ICD-10-CM

## 2015-04-20 DIAGNOSIS — J40 Bronchitis, not specified as acute or chronic: Secondary | ICD-10-CM | POA: Diagnosis not present

## 2015-04-20 MED ORDER — DOXYCYCLINE HYCLATE 100 MG PO TABS: 100.0000 mg | ORAL_TABLET | Freq: Two times a day (BID) | ORAL | Status: DC

## 2015-04-20 MED ORDER — ALBUTEROL SULFATE 1.25 MG/3ML IN NEBU
1.2500 mg | INHALATION_SOLUTION | Freq: Once | RESPIRATORY_TRACT | Status: AC
  Administered 2015-04-20: 1.25 mg via RESPIRATORY_TRACT

## 2015-04-20 MED ORDER — PREDNISONE 20 MG PO TABS: ORAL_TABLET | ORAL | Status: DC

## 2015-04-20 NOTE — Patient Instructions (Signed)
Treating for secondaryu infection sinus infection and   Wheezy bronchitis  Antibiotic and  3- 15 days of prednisone if needed.   Expect significant improvement in the next 5 days  Contact us if not  Improving or fever etc.  The albuteral may help some . still

## 2015-04-20 NOTE — Progress Notes (Signed)
Pre visit review using our clinic review tool, if applicable. No additional management support is needed unless otherwise documented below in the visit note.  Chief Complaint  Patient presents with  . Cough    Treated for the flu in [place].  Cough and nasal congestion continue  . Nasal Congestion    HPI: Patient Melissa Brown  comes in today for SDA for  new problem evaluation. Onset dec 20  When in [place]  Began w  Sore throat.  Then fever 102 range   Had chest x ray and no pna and had neg strep and flu  AB....  Given tamiflu empiricallu  Fever resolved in 1 day but now   3 weeks still cough mucous and stuffy nose .    No fever  Not healed .Marland Kitchen  No one else    Got ssick hard to move the mucous  No  Hx of asthma  Albuterol and   Throat clearing .  Drainage .    Coughing at night and wheezing ?sounds in upper chest .about 60   % better than at onset but not  Improving much  ROS: See pertinent positives and negatives per HPI.  Past Medical History  Diagnosis Date  . Breast cancer ([place]) 2005    left dcis  . Diabetes, gestational   . Scoliosis   . 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    Doristine Counter DIABETES 02/09/2007    Qualifier: History of  By: Regis Bill MD, Standley Brooking   . Allergic     to grass and moldsby skin testing   . Palpitations     tachy, supressed with atenolol  . MVP (mitral valve prolapse)     ANTERIOR LEAFLET, MILD MR  . Breast cancer ([place])   . Thyroid disease     HYPERTHYROIDISM    Family History  Problem Relation Age of Onset  . Diabetes Mother   . Thyroid disease Mother   . Hypertension Mother   . Diabetes Father   . Heart disease Father   . Diabetes Other     Social History   Social History  . Marital Status: Married    Spouse Name: N/A  . Number of Children: N/A  . Years of Education: N/A   Social History Main Topics  . Smoking status: Never Smoker   . Smokeless tobacco: None  . Alcohol Use: 0.5  oz/week    1 drink(s) per week  . Drug Use: No  . Sexual Activity: Not Asked   Other Topics Concern  . 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 betamethasone dipropionate (DIPROLENE) 0.05 % cream Apply topically 2 (two) times daily. To affected area; not on face 15 g 2  . Cholecalciferol (VITAMIN D-3 PO) Take by mouth.    . ferrous sulfate 325 (65 FE) MG tablet Take 325 mg by mouth daily with breakfast. During period    . Multiple Vitamins-Calcium (ONE-A-DAY WOMENS PO) Take by mouth daily.       No  facility-administered medications prior to visit.     EXAM:  BP 120/84 mmHg  Pulse 107  Temp(Src) 98.5 F (36.9 C) (Oral)  Wt 127 lb 1.6 oz (57.652 kg)  SpO2 97%  Body mass index is 23.63 kg/(m^2). WDWN in NAD  quiet respirations; mod congested  somewhat hoarse. Non toxic . HEENT: Normocephalic ;atraumatic , Eyes;  PERRL, EOMs  Full, lids and conjunctiva clear,,Ears: no deformities, canals nl, TM landmarks normal, Nose: no deformity mucoid  discharge  congested;face minimally tender Mouth : OP clear without lesion or edema .cobblestoning Neck: Supple without adenopathy or masses or bruits Chest:  Dec bs few musical sounds  No rales or rhonchi after nebulizer increase bs and no wheeze or rhonchi  CV:  S1-S2 no gallops or murmurs peripheral perfusion is normal Skin :nl perfusion and no acute rashes  PSYCH: pleasant and cooperative, no obvious depression or anxiety  ASSESSMENT AND PLAN:  Discussed the following assessment and plan:  Acute upper respiratory infection of multiple sites - prolonged poss seoncdarya infection sinu and wheezing  - Plan: albuterol (ACCUNEB) nebulizer solution 1.25 mg  Wheezy  bronchitis - Plan: albuterol (ACCUNEB) nebulizer solution 1.25 mg  Protracted URI Fl like illness  Fever gone but poss reactive  resp sx and prolonged uri empiric rx for sinusitis and short course pred    Expectant management. Can try saline and  mucinex if helps also  -Patient advised to return or notify health care team  if symptoms worsen ,persist or new concerns arise.  Patient Instructions  Treating for secondaryu infection sinus infection and   Wheezy bronchitis  Antibiotic and  3- 15 days of prednisone if needed.   Expect significant improvement in the next 5 days  Contact us if not  Improving or fever etc.  The albuteral may help some . still     Standley Brooking. Aanika Defoor M.D.

## 2016-01-11 DIAGNOSIS — D0512 Intraductal carcinoma in situ of left breast: Secondary | ICD-10-CM | POA: Diagnosis not present

## 2016-01-11 DIAGNOSIS — R928 Other abnormal and inconclusive findings on diagnostic imaging of breast: Secondary | ICD-10-CM | POA: Diagnosis not present

## 2016-01-29 ENCOUNTER — Other Ambulatory Visit (INDEPENDENT_AMBULATORY_CARE_PROVIDER_SITE_OTHER): Payer: BLUE CROSS/BLUE SHIELD

## 2016-01-29 DIAGNOSIS — Z0000 Encounter for general adult medical examination without abnormal findings: Secondary | ICD-10-CM | POA: Diagnosis not present

## 2016-01-29 DIAGNOSIS — E559 Vitamin D deficiency, unspecified: Secondary | ICD-10-CM | POA: Diagnosis not present

## 2016-01-29 DIAGNOSIS — R7989 Other specified abnormal findings of blood chemistry: Secondary | ICD-10-CM

## 2016-01-29 LAB — LIPID PANEL
CHOL/HDL RATIO: 5
CHOLESTEROL: 233 mg/dL — AB (ref 0–200)
HDL: 45.6 mg/dL (ref >39.00)
NonHDL: 187.06
TRIGLYCERIDES: 222 mg/dL — AB (ref 0.0–149.0)
VLDL: 44.4 mg/dL — ABNORMAL HIGH (ref 0.0–40.0)

## 2016-01-29 LAB — HEPATIC FUNCTION PANEL
ALBUMIN: 4.4 g/dL (ref 3.5–5.2)
ALT: 18 U/L (ref 0–35)
AST: 16 U/L (ref 0–37)
Alkaline Phosphatase: 57 U/L (ref 39–117)
BILIRUBIN TOTAL: 0.5 mg/dL (ref 0.2–1.2)
Bilirubin, Direct: 0.1 mg/dL (ref 0.0–0.3)
TOTAL PROTEIN: 7.4 g/dL (ref 6.0–8.3)

## 2016-01-29 LAB — LDL CHOLESTEROL, DIRECT: Direct LDL: 166 mg/dL

## 2016-01-29 LAB — BASIC METABOLIC PANEL
BUN: 12 mg/dL (ref 6–23)
CALCIUM: 9.2 mg/dL (ref 8.4–10.5)
CO2: 28 meq/L (ref 19–32)
Chloride: 103 mEq/L (ref 96–112)
Creatinine, Ser: 0.69 mg/dL (ref 0.40–1.20)
GFR: 95.37 mL/min (ref >60.00)
GLUCOSE: 121 mg/dL — AB (ref 70–99)
POTASSIUM: 4.2 meq/L (ref 3.5–5.1)
SODIUM: 137 meq/L (ref 135–145)

## 2016-01-29 LAB — TSH: TSH: 1.2 u[IU]/mL (ref 0.35–4.50)

## 2016-01-29 LAB — CBC WITH DIFFERENTIAL/PLATELET
BASOS PCT: 0.5 % (ref 0.0–3.0)
Basophils Absolute: 0 10*3/uL (ref 0.0–0.1)
EOS PCT: 3.1 % (ref 0.0–5.0)
Eosinophils Absolute: 0.2 10*3/uL (ref 0.0–0.7)
HEMATOCRIT: 38 % (ref 36.0–46.0)
HEMOGLOBIN: 12.8 g/dL (ref 12.0–15.0)
LYMPHS PCT: 23.7 % (ref 12.0–46.0)
Lymphs Abs: 1.4 10*3/uL (ref 0.7–4.0)
MCHC: 33.6 g/dL (ref 30.0–36.0)
MCV: 92.1 fl (ref 78.0–100.0)
MONO ABS: 0.4 10*3/uL (ref 0.1–1.0)
MONOS PCT: 6.3 % (ref 3.0–12.0)
Neutro Abs: 4 10*3/uL (ref 1.4–7.7)
Neutrophils Relative %: 66.4 % (ref 43.0–77.0)
Platelets: 297 10*3/uL (ref 150.0–400.0)
RBC: 4.13 Mil/uL (ref 3.87–5.11)
RDW: 14.2 % (ref 11.5–15.5)
WBC: 6 10*3/uL (ref 4.0–10.5)

## 2016-01-29 LAB — VITAMIN D 25 HYDROXY (VIT D DEFICIENCY, FRACTURES): VITD: 27.79 ng/mL — AB (ref 30.00–100.00)

## 2016-02-02 NOTE — Progress Notes (Signed)
Pre visit review using our clinic review tool, if applicable. No additional management support is needed unless otherwise documented below in the visit note.  Chief Complaint  Patient presents with  . Annual Exam    HPI: Patient  Melissa Brown  50 y.o. comes in today for [facility] visit  Not enough exercise  Recently  Some weight gain   Rash itchy neck insurance  Not paying for this  No major change in health. No fam hx coloncancer  Polyps of gi bleeding . Personal .  Health Maintenance  Topic Date Due  . PNEUMOCOCCAL POLYSACCHARIDE VACCINE (1) 03/09/1967  . FOOT EXAM  03/09/1975  . OPHTHALMOLOGY EXAM  03/09/1975  . URINE MICROALBUMIN  01/15/2014  . COLONOSCOPY  03/09/2015  . PAP SMEAR  03/10/2015  . HEMOGLOBIN A1C  07/31/2015  . HIV Screening  02/03/2017 (Originally 03/08/1980)  . TETANUS/TDAP  07/07/2016  . MAMMOGRAM  01/10/2018  . INFLUENZA VACCINE  Addressed   Health Maintenance Review LIFESTYLE:  Exercise:  Not now  Tobacco/ETS:n Alcohol: ocass Sugar beverages: no Sleep:7.5 Drug use: no HH of 3+  Work:40 +     ROS:  Remote hx of tachy  Cards eval neg  nosxc   ekg ok inpast  No need for med now  GEN/ HEENT: No fever, significant weight changes sweats headaches vision problems hearing changes, CV/ PULM; No chest pain shortness of breath cough, syncope,edema  change in exercise tolerance. GI /GU: No adominal pain, vomiting, change in bowel habits. No blood in the stool. No significant GU symptoms. SKIN/HEME: ,no acute skin rashes suspicious lesions or bleeding. No lymphadenopathy, nodules, masses.  NEURO/ PSYCH:  No neurologic signs such as weakness numbness. No depression anxiety. IMM/ Allergy: No unusual infections.  Allergy .   REST of 12 system review negative except as per HPI   Past Medical History:  Diagnosis Date  . Allergic    to grass and moldsby skin testing   . Angioedema of lips 12/07/2010   Hx of seem in August 12  remote hx of hives  as a child.    . Breast cancer ([facility]) 2005   left dcis  . Breast cancer ([facility])   . Diabetes, gestational   . GESTATIONAL DIABETES 02/09/2007   Qualifier: History of  By: Regis Bill MD, Standley Brooking   . MVP (mitral valve prolapse)    ANTERIOR LEAFLET, MILD MR  . Palpitations    tachy, supressed with atenolol  . Scoliosis   . Thyroid disease    HYPERTHYROIDISM    Past Surgical History:  Procedure Laterality Date  . BREAST LUMPECTOMY     radiation DCIS- reconstruction left  . CESAREAN SECTION      Family History  Problem Relation Age of Onset  . Diabetes Mother   . Thyroid disease Mother   . Hypertension Mother   . Diabetes Father   . Heart disease Father   . Diabetes Other     Social History   Social History  . Marital status: Married    Spouse name: N/A  . Number of children: N/A  . Years of education: N/A   Social History Main Topics  . Smoking status: Never Smoker  . Smokeless tobacco: None  . Alcohol use 0.5 oz/week    1 drink(s) per week  . Drug use: No  . Sexual activity: Not Asked   Other Topics Concern  . 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 betamethasone dipropionate (DIPROLENE) 0.05 % cream Apply topically 2 (two) times daily. To affected area; not on face 15 g 2  . Cholecalciferol (VITAMIN D-3 PO) Take by mouth.    . ferrous sulfate 325 (65 FE) MG tablet Take 325 mg by mouth daily with breakfast. During period    . Multiple Vitamins-Calcium (ONE-A-DAY WOMENS PO) Take by mouth daily.      Marland Kitchen doxycycline (VIBRA-TABS) 100 MG tablet Take 1 tablet (100 mg total) by mouth 2 (two) times daily. 14 tablet 0  . predniSONE (DELTASONE) 20 MG tablet 1 po bid for 3-5 days for wheezing 10 tablet 0     No facility-administered medications prior to visit.      EXAM:  BP (!) 138/94 (BP Location: Right Arm, Patient Position: Sitting, Cuff Size: Normal)   Temp 98.3 F (36.8 C) (Oral)   Ht 5' (1.524 m)   Wt 130 lb 6.4 oz (59.1 kg)   BMI 25.47 kg/m   Body mass index is 25.47 kg/m.  Physical Exam: Vital signs reviewed JHE:RDEY is a well-developed well-nourished alert cooperative    who appearsr stated age in no acute distress.  HEENT: normocephalic atraumatic , Eyes: PERRL EOM's full, conjunctiva clear, Nares: paten,t no deformity discharge or tenderness., Ears: no deformity EAC's r clear left some wax TMs with normal landmarks. Mouth: clear OP, no lesions, edema.  Moist mucous membranes. Dentition in adequate repair. NECK: supple without masses, thyromegaly or bruits. CHEST/PULM:  Clear to auscultation and percussion breath sounds equal no wheeze , rales or rhonchi. No chest wall deformities or tenderness.breast   Well healed  Scar noted right no lump axilla is clear  CV: PMI is nondisplaced, S1 S2 no gallops, murmurs, rubs. dont hear click Peripheral pulses are full without delay.No JVD .  ABDOMEN: Bowel sounds normal nontender  No guard or rebound, no hepato splenomegal no CVA tenderness.  No hernia. Extremtities:  No clubbing cyanosis or edema, no acute joint swelling or redness no focal atrophy NEURO:  Oriented x3, cranial nerves 3-12 appear to be intact, no obvious focal weakness,gait within normal limits dtrs present  SKIN: No acute rashes normal turgor, color, no bruising or petechiae. righ t neck area with linear l  Pink rash lichenified  No vesicle or plaque  PSYCH: Oriented, good eye contact, no obvious depression anxiety, cognition and judgment appear normal. LN: no cervical axillary inguinal adenopathy  Lab Results  Component Value Date   WBC 6.0 01/29/2016   HGB 12.8 01/29/2016   HCT 38.0 01/29/2016   PLT 297.0 01/29/2016   GLUCOSE 121 (H) 01/29/2016   CHOL 233 (H)  01/29/2016   TRIG 222.0 (H) 01/29/2016   HDL 45.60 01/29/2016   LDLDIRECT 166.0 01/29/2016   LDLCALC 150 (H) 01/30/2015   ALT 18 01/29/2016   AST 16 01/29/2016   NA 137 01/29/2016   K 4.2 01/29/2016   CL 103 01/29/2016   CREATININE 0.69 01/29/2016   BUN 12 01/29/2016   CO2 28 01/29/2016   TSH 1.20 01/29/2016   HGBA1C 5.9 02/05/2016   MICROALBUR 0.5 01/15/2013    ASSESSMENT AND PLAN:  Discussed the following assessment and plan:  Visit for preventive health examination - will le [place] know about colon referral or if wants cologuard . uncertain about varicella status  Hyperglycemia - no diabetes by a1c but at risk hx gestational dm - Plan: POCT A1C  Hyperlipidemia, unspecified hyperlipidemia type  Low vitamin D level - inc to 2000iu per/d  Dermatitis - neck insurance denial for current cream try lidex for 2 weeks + then as needed   HX: breast cancer  Patient Care Team: Burnis Medin, MD as PCP - General Dorien Chihuahua, MD as Referring Physician (Obstetrics and Gynecology) liu debra  Harvie Heck, MD as Consulting Physician (Internal Medicine) Patient Instructions  Track exercising oral intake to help change lifestyle and improve your cholesterol and triglycerides. Exercise should include some resistance training. Healthy weight loss should help your metabolic profile. Your blood sugar is not diabetic but at risk to become diabetic. Increase your vitamin D /2000 international units a day. Send [place] a message about colon cancer screening colonoscopy with a given provider and or coverage for cologuard. Plan 6 month lab work fasting lipid profile and hemoglobin A1c. Follow-up depending on results or definitely yearly checkup. Check on chickenpox or varicella immunization. We can consider getting a serology to see if you are immune to chickenpox. If this time we do not give your shingles vaccine to those who have not had chickenpox. Try a different topical cream for your itchy  rash on your neck let us know if insurance covers this or we need to try again.   Food Choices to Lower Your Triglycerides Triglycerides are a type of fat in your blood. High levels of triglycerides can increase the risk of heart disease and stroke. If your triglyceride levels are high, the foods you eat and your eating habits are very important. Choosing the right foods can help lower your triglycerides.  WHAT GENERAL GUIDELINES DO I NEED TO FOLLOW?  Lose weight if you are overweight.   Limit or avoid alcohol.   Fill one half of your plate with vegetables and green salads.   Limit fruit to two servings a day. Choose fruit instead of juice.   Make one fourth of your plate whole grains. Look for the word "whole" as the first word in the ingredient list.  Fill one fourth of your plate with lean protein foods.  Enjoy fatty fish (such as salmon, mackerel, sardines, and tuna) three times a week.   Choose healthy fats.   Limit foods high in starch and sugar.  Eat more home-cooked food and less restaurant, buffet, and fast food.  Limit fried foods.  Cook foods using methods other than frying.  Limit saturated fats.  Check ingredient lists to avoid foods with partially hydrogenated oils (trans fats) in them. WHAT FOODS CAN I EAT?  Grains Whole grains, such as whole wheat or whole grain breads, crackers, cereals, and pasta. Unsweetened oatmeal, bulgur, barley, quinoa, or brown rice. Corn or whole wheat flour tortillas.  Vegetables Fresh or frozen vegetables (raw, steamed, roasted, or grilled). Green salads. Fruits All fresh, canned (in natural juice), or frozen fruits. Meat and Other Protein Products Ground beef (85% or leaner), grass-fed beef, or beef trimmed of fat. Skinless chicken or [place]. Ground chicken or [place]. Pork trimmed of fat. All fish and seafood. Eggs. Dried beans, peas, or lentils. Unsalted nuts or seeds. Unsalted canned or dry beans. Dairy Low-fat dairy  products, such as skim or 1% milk, 2% or reduced-fat cheeses, low-fat ricotta or cottage cheese, or plain  low-fat yogurt. Fats and Oils Tub margarines without trans fats. Light or reduced-fat mayonnaise and salad dressings. Avocado. Safflower, olive, or canola oils. Natural peanut or almond butter. The items listed above may not be a complete list of recommended foods or beverages. Contact your dietitian for more options. WHAT FOODS ARE NOT RECOMMENDED?  Grains White bread. White pasta. White rice. Cornbread. Bagels, pastries, and croissants. Crackers that contain trans fat. Vegetables White potatoes. Corn. Creamed or fried vegetables. Vegetables in a cheese sauce. Fruits Dried fruits. Canned fruit in light or heavy syrup. Fruit juice. Meat and Other Protein Products Fatty cuts of meat. Ribs, chicken wings, bacon, sausage, bologna, salami, chitterlings, fatback, hot dogs, bratwurst, and packaged luncheon meats. Dairy Whole or 2% milk, cream, half-and-half, and cream cheese. Whole-fat or sweetened yogurt. Full-fat cheeses. Nondairy creamers and whipped toppings. Processed cheese, cheese spreads, or cheese curds. Sweets and Desserts Corn syrup, sugars, honey, and molasses. Candy. Jam and jelly. Syrup. Sweetened cereals. Cookies, pies, cakes, donuts, muffins, and ice cream. Fats and Oils Butter, stick margarine, lard, shortening, ghee, or bacon fat. Coconut, palm kernel, or palm oils. Beverages Alcohol. Sweetened drinks (such as sodas, lemonade, and fruit drinks or punches). The items listed above may not be a complete list of foods and beverages to avoid. Contact your dietitian for more information.   This information is not intended to replace advice given to you by your health care provider. Make sure you discuss any questions you have with your health care provider.   Document Released: 01/11/2004 Document Revised: 04/15/2014 Document Reviewed: 01/27/2013 Elsevier Interactive Patient  Education 2016 [place] Maintenance, Female Adopting a healthy lifestyle and getting preventive care can go a long way to promote health and wellness. Talk with your health care provider about what schedule of regular examinations is right for you. This is a good chance for you to check in with your provider about disease prevention and staying healthy. In between checkups, there are plenty of things you can do on your own. Experts have done a lot of research about which lifestyle changes and preventive measures are most likely to keep you healthy. Ask your health care provider for more information. WEIGHT AND DIET  Eat a healthy diet  Be sure to include plenty of vegetables, fruits, low-fat dairy products, and lean protein.  Do not eat a lot of foods high in solid fats, added sugars, or salt.  Get regular exercise. This is one of the most important things you can do for your health.  Most adults should exercise for at least 150 minutes each week. The exercise should increase your heart rate and make you sweat (moderate-intensity exercise).  Most adults should also do strengthening exercises at least twice a week. This is in addition to the moderate-intensity exercise.  Maintain a healthy weight  Body mass index (BMI) is a measurement that can be used to identify possible weight problems. It estimates body fat based on height and weight. Your health care provider can help determine your BMI and help you achieve or maintain a healthy weight.  For females 45 years of age and older:   A BMI below 18.5 is considered underweight.  A BMI of 18.5 to 24.9 is normal.  A BMI of 25 to 29.9 is considered overweight.  A BMI of 30 and above is considered obese.  Watch levels of cholesterol and blood lipids  You should start having your blood tested for lipids and cholesterol at 51 years of  age, then have this test every 5 years.  You may need to have your cholesterol levels  checked more often if:  Your lipid or cholesterol levels are high.  You are older than 51 years of age.  You are at high risk for heart disease.  CANCER SCREENING   Lung Cancer  Lung cancer screening is recommended for adults 92-79 years old who are at high risk for lung cancer because of a history of smoking.  A yearly low-dose CT scan of the lungs is recommended for people who:  Currently smoke.  Have quit within the past 15 years.  Have at least a 30-pack-year history of smoking. A pack year is smoking an average of one pack of cigarettes a day for 1 year.  Yearly screening should continue until it has been 15 years since you quit.  Yearly screening should stop if you develop a health problem that would prevent you from having lung cancer treatment.  Breast Cancer  Practice breast self-awareness. This means understanding how your breasts normally appear and feel.  It also means doing regular breast self-exams. Let your health care provider know about any changes, no matter how small.  If you are in your 20s or 30s, you should have a clinical breast exam (CBE) by a health care provider every 1-3 years as part of a regular health exam.  If you are 29 or older, have a CBE every year. Also consider having a breast X-ray (mammogram) every year.  If you have a family history of breast cancer, talk to your health care provider about genetic screening.  If you are at high risk for breast cancer, talk to your health care provider about having an MRI and a mammogram every year.  Breast cancer gene (BRCA) assessment is recommended for women who have family members with BRCA-related cancers. BRCA-related cancers include:  Breast.  Ovarian.  Tubal.  Peritoneal cancers.  Results of the assessment will determine the need for genetic counseling and BRCA1 and BRCA2 testing. Cervical Cancer Your health care provider may recommend that you be screened regularly for cancer of the  pelvic organs (ovaries, uterus, and vagina). This screening involves a pelvic examination, including checking for microscopic changes to the surface of your cervix (Pap test). You may be encouraged to have this screening done every 3 years, beginning at age 93.  For women ages 77-65, health care providers may recommend pelvic exams and Pap testing every 3 years, or they may recommend the Pap and pelvic exam, combined with testing for human papilloma virus (HPV), every 5 years. Some types of HPV increase your risk of cervical cancer. Testing for HPV may also be done on women of any age with unclear Pap test results.  Other health care providers may not recommend any screening for nonpregnant women who are considered low risk for pelvic cancer and who do not have symptoms. Ask your health care provider if a screening pelvic exam is right for you.  If you have had past treatment for cervical cancer or a condition that could lead to cancer, you need Pap tests and screening for cancer for at least 20 years after your treatment. If Pap tests have been discontinued, your risk factors (such as having a new sexual partner) need to be reassessed to determine if screening should resume. Some women have medical problems that increase the chance of getting cervical cancer. In these cases, your health care provider may recommend more frequent screening and Pap tests. Colorectal  Cancer  This type of cancer can be detected and often prevented.  Routine colorectal cancer screening usually begins at 51 years of age and continues through 51 years of age.  Your health care provider may recommend screening at an earlier age if you have risk factors for colon cancer.  Your health care provider may also recommend using home test kits to check for hidden blood in the stool.  A small camera at the end of a tube can be used to examine your colon directly (sigmoidoscopy or colonoscopy). This is done to check for the earliest  forms of colorectal cancer.  Routine screening usually begins at age 17.  Direct examination of the colon should be repeated every 5-10 years through 51 years of age. However, you may need to be screened more often if early forms of precancerous polyps or small growths are found. Skin Cancer  Check your skin from head to toe regularly.  Tell your health care provider about any new moles or changes in moles, especially if there is a change in a mole's shape or color.  Also tell your health care provider if you have a mole that is larger than the size of a pencil eraser.  Always use sunscreen. Apply sunscreen liberally and repeatedly throughout the day.  Protect yourself by wearing long sleeves, pants, a wide-brimmed hat, and sunglasses whenever you are outside. HEART DISEASE, DIABETES, AND HIGH BLOOD PRESSURE   High blood pressure causes heart disease and increases the risk of stroke. High blood pressure is more likely to develop in:  People who have blood pressure in the high end of the normal range (130-139/85-89 mm Hg).  People who are overweight or obese.  People who are African American.  If you are 75-6 years of age, have your blood pressure checked every 3-5 years. If you are 39 years of age or older, have your blood pressure checked every year. You should have your blood pressure measured twice--once when you are at a hospital or clinic, and once when you are not at a hospital or clinic. Record the average of the two measurements. To check your blood pressure when you are not at a hospital or clinic, you can use:  An automated blood pressure machine at a pharmacy.  A home blood pressure monitor.  If you are between 11 years and 94 years old, ask your health care provider if you should take aspirin to prevent strokes.  Have regular diabetes screenings. This involves taking a blood sample to check your fasting blood sugar level.  If you are at a normal weight and have a low  risk for diabetes, have this test once every three years after 51 years of age.  If you are overweight and have a high risk for diabetes, consider being tested at a younger age or more often. PREVENTING INFECTION  Hepatitis B  If you have a higher risk for hepatitis B, you should be screened for this virus. You are considered at high risk for hepatitis B if:  You were born in a country where hepatitis B is common. Ask your health care provider which countries are considered high risk.  Your parents were born in a high-risk country, and you have not been immunized against hepatitis B (hepatitis B vaccine).  You have HIV or AIDS.  You use needles to inject street drugs.  You live with someone who has hepatitis B.  You have had sex with someone who has hepatitis B.  You  get hemodialysis treatment.  You take certain medicines for conditions, including cancer, organ transplantation, and autoimmune conditions. Hepatitis C  Blood testing is recommended for:  Everyone born from [birth year] through 1966.  Anyone with known risk factors for hepatitis C. Sexually transmitted infections (STIs)  You should be screened for sexually transmitted infections (STIs) including gonorrhea and chlamydia if:  You are sexually active and are younger than 51 years of age.  You are older than 51 years of age and your health care provider tells you that you are at risk for this type of infection.  Your sexual activity has changed since you were last screened and you are at an increased risk for chlamydia or gonorrhea. Ask your health care provider if you are at risk.  If you do not have HIV, but are at risk, it may be recommended that you take a prescription medicine daily to prevent HIV infection. This is called pre-exposure prophylaxis (PrEP). You are considered at risk if:  You are sexually active and do not regularly use condoms or know the HIV status of your partner(s).  You take drugs by  injection.  You are sexually active with a partner who has HIV. Talk with your health care provider about whether you are at high risk of being infected with HIV. If you choose to begin PrEP, you should first be tested for HIV. You should then be tested every 3 months for as long as you are taking PrEP.  PREGNANCY   If you are premenopausal and you may become pregnant, ask your health care provider about preconception counseling.  If you may become pregnant, take 400 to 800 micrograms (mcg) of folic acid every day.  If you want to prevent pregnancy, talk to your health care provider about birth control (contraception). OSTEOPOROSIS AND MENOPAUSE   Osteoporosis is a disease in which the bones lose minerals and strength with aging. This can result in serious bone fractures. Your risk for osteoporosis can be identified using a bone density scan.  If you are 66 years of age or older, or if you are at risk for osteoporosis and fractures, ask your health care provider if you should be screened.  Ask your health care provider whether you should take a calcium or vitamin D supplement to lower your risk for osteoporosis.  Menopause may have certain physical symptoms and risks.  Hormone replacement therapy may reduce some of these symptoms and risks. Talk to your health care provider about whether hormone replacement therapy is right for you.  HOME CARE INSTRUCTIONS   Schedule regular health, dental, and eye exams.  Stay current with your immunizations.   Do not use any tobacco products including cigarettes, chewing tobacco, or electronic cigarettes.  If you are pregnant, do not drink alcohol.  If you are breastfeeding, limit how much and how often you drink alcohol.  Limit alcohol intake to no more than 1 drink per day for nonpregnant women. One drink equals 12 ounces of beer, 5 ounces of wine, or 1 ounces of hard liquor.  Do not use street drugs.  Do not share needles.  Ask your  health care provider for help if you need support or information about quitting drugs.  Tell your health care provider if you often feel depressed.  Tell your health care provider if you have ever been abused or do not feel safe at home.   This information is not intended to replace advice given to you by your health care provider.  Make sure you discuss any questions you have with your health care provider.   Document Released: 10/08/2010 Document Revised: 04/15/2014 Document Reviewed: 02/24/2013 Elsevier Interactive Patient Education 2016 Kirk K. Camreigh Michie M.D.

## 2016-02-05 ENCOUNTER — Encounter: Payer: Self-pay | Admitting: Internal Medicine

## 2016-02-05 ENCOUNTER — Ambulatory Visit (INDEPENDENT_AMBULATORY_CARE_PROVIDER_SITE_OTHER): Payer: BLUE CROSS/BLUE SHIELD | Admitting: Internal Medicine

## 2016-02-05 VITALS — BP 138/94 | Temp 98.3°F | Ht 60.0 in | Wt 130.4 lb

## 2016-02-05 DIAGNOSIS — R7989 Other specified abnormal findings of blood chemistry: Secondary | ICD-10-CM

## 2016-02-05 DIAGNOSIS — E785 Hyperlipidemia, unspecified: Secondary | ICD-10-CM | POA: Diagnosis not present

## 2016-02-05 DIAGNOSIS — R739 Hyperglycemia, unspecified: Secondary | ICD-10-CM | POA: Diagnosis not present

## 2016-02-05 DIAGNOSIS — E559 Vitamin D deficiency, unspecified: Secondary | ICD-10-CM

## 2016-02-05 DIAGNOSIS — L309 Dermatitis, unspecified: Secondary | ICD-10-CM

## 2016-02-05 DIAGNOSIS — Z853 Personal history of malignant neoplasm of breast: Secondary | ICD-10-CM

## 2016-02-05 DIAGNOSIS — Z0000 Encounter for general adult medical examination without abnormal findings: Secondary | ICD-10-CM | POA: Diagnosis not present

## 2016-02-05 LAB — POCT GLYCOSYLATED HEMOGLOBIN (HGB A1C): HEMOGLOBIN A1C: 5.9

## 2016-02-05 MED ORDER — FLUOCINONIDE-E 0.05 % EX CREA: 1.0000 "application " | TOPICAL_CREAM | Freq: Two times a day (BID) | CUTANEOUS | 1 refills | Status: DC

## 2016-02-05 NOTE — Patient Instructions (Addendum)
Track exercising oral intake to help change lifestyle and improve your cholesterol and triglycerides. Exercise should include some resistance training. Healthy weight loss should help your metabolic profile. Your blood sugar is not diabetic but at risk to become diabetic. Increase your vitamin D /2000 international units a day. Send [place] a message about colon cancer screening colonoscopy with a given provider and or coverage for cologuard. Plan 6 month lab work fasting lipid profile and hemoglobin A1c. Follow-up depending on results or definitely yearly checkup. Check on chickenpox or varicella immunization. We can consider getting a serology to see if you are immune to chickenpox. If this time we do not give your shingles vaccine to those who have not had chickenpox. Try a different topical cream for your itchy rash on your neck let us know if insurance covers this or we need to try again.   Food Choices to Lower Your Triglycerides Triglycerides are a type of fat in your blood. High levels of triglycerides can increase the risk of heart disease and stroke. If your triglyceride levels are high, the foods you eat and your eating habits are very important. Choosing the right foods can help lower your triglycerides.  WHAT GENERAL GUIDELINES DO I NEED TO FOLLOW?  Lose weight if you are overweight.   Limit or avoid alcohol.   Fill one half of your plate with vegetables and green salads.   Limit fruit to two servings a day. Choose fruit instead of juice.   Make one fourth of your plate whole grains. Look for the word "whole" as the first word in the ingredient list.  Fill one fourth of your plate with lean protein foods.  Enjoy fatty fish (such as salmon, mackerel, sardines, and tuna) three times a week.   Choose healthy fats.   Limit foods high in starch and sugar.  Eat more home-cooked food and less restaurant, buffet, and fast food.  Limit fried foods.  Cook foods using methods  other than frying.  Limit saturated fats.  Check ingredient lists to avoid foods with partially hydrogenated oils (trans fats) in them. WHAT FOODS CAN I EAT?  Grains Whole grains, such as whole wheat or whole grain breads, crackers, cereals, and pasta. Unsweetened oatmeal, bulgur, barley, quinoa, or brown rice. Corn or whole wheat flour tortillas.  Vegetables Fresh or frozen vegetables (raw, steamed, roasted, or grilled). Green salads. Fruits All fresh, canned (in natural juice), or frozen fruits. Meat and Other Protein Products Ground beef (85% or leaner), grass-fed beef, or beef trimmed of fat. Skinless chicken or [place]. Ground chicken or [place]. Pork trimmed of fat. All fish and seafood. Eggs. Dried beans, peas, or lentils. Unsalted nuts or seeds. Unsalted canned or dry beans. Dairy Low-fat dairy products, such as skim or 1% milk, 2% or reduced-fat cheeses, low-fat ricotta or cottage cheese, or plain low-fat yogurt. Fats and Oils Tub margarines without trans fats. Light or reduced-fat mayonnaise and salad dressings. Avocado. Safflower, olive, or canola oils. Natural peanut or almond butter. The items listed above may not be a complete list of recommended foods or beverages. Contact your dietitian for more options. WHAT FOODS ARE NOT RECOMMENDED?  Grains White bread. White pasta. White rice. Cornbread. Bagels, pastries, and croissants. Crackers that contain trans fat. Vegetables White potatoes. Corn. Creamed or fried vegetables. Vegetables in a cheese sauce. Fruits Dried fruits. Canned fruit in light or heavy syrup. Fruit juice. Meat and Other Protein Products Fatty cuts of meat. Ribs, chicken wings, bacon, sausage, bologna, salami,  chitterlings, fatback, hot dogs, bratwurst, and packaged luncheon meats. Dairy Whole or 2% milk, cream, half-and-half, and cream cheese. Whole-fat or sweetened yogurt. Full-fat cheeses. Nondairy creamers and whipped toppings. Processed cheese, cheese  spreads, or cheese curds. Sweets and Desserts Corn syrup, sugars, honey, and molasses. Candy. Jam and jelly. Syrup. Sweetened cereals. Cookies, pies, cakes, donuts, muffins, and ice cream. Fats and Oils Butter, stick margarine, lard, shortening, ghee, or bacon fat. Coconut, palm kernel, or palm oils. Beverages Alcohol. Sweetened drinks (such as sodas, lemonade, and fruit drinks or punches). The items listed above may not be a complete list of foods and beverages to avoid. Contact your dietitian for more information.   This information is not intended to replace advice given to you by your health care provider. Make sure you discuss any questions you have with your health care provider.   Document Released: 01/11/2004 Document Revised: 04/15/2014 Document Reviewed: 01/27/2013 Elsevier Interactive Patient Education 2016 [place] Maintenance, Female Adopting a healthy lifestyle and getting preventive care can go a long way to promote health and wellness. Talk with your health care provider about what schedule of regular examinations is right for you. This is a good chance for you to check in with your provider about disease prevention and staying healthy. In between checkups, there are plenty of things you can do on your own. Experts have done a lot of research about which lifestyle changes and preventive measures are most likely to keep you healthy. Ask your health care provider for more information. WEIGHT AND DIET  Eat a healthy diet  Be sure to include plenty of vegetables, fruits, low-fat dairy products, and lean protein.  Do not eat a lot of foods high in solid fats, added sugars, or salt.  Get regular exercise. This is one of the most important things you can do for your health.  Most adults should exercise for at least 150 minutes each week. The exercise should increase your heart rate and make you sweat (moderate-intensity exercise).  Most adults should also do  strengthening exercises at least twice a week. This is in addition to the moderate-intensity exercise.  Maintain a healthy weight  Body mass index (BMI) is a measurement that can be used to identify possible weight problems. It estimates body fat based on height and weight. Your health care provider can help determine your BMI and help you achieve or maintain a healthy weight.  For females 75 years of age and older:   A BMI below 18.5 is considered underweight.  A BMI of 18.5 to 24.9 is normal.  A BMI of 25 to 29.9 is considered overweight.  A BMI of 30 and above is considered obese.  Watch levels of cholesterol and blood lipids  You should start having your blood tested for lipids and cholesterol at 51 years of age, then have this test every 5 years.  You may need to have your cholesterol levels checked more often if:  Your lipid or cholesterol levels are high.  You are older than 51 years of age.  You are at high risk for heart disease.  CANCER SCREENING   Lung Cancer  Lung cancer screening is recommended for adults 36-37 years old who are at high risk for lung cancer because of a history of smoking.  A yearly low-dose CT scan of the lungs is recommended for people who:  Currently smoke.  Have quit within the past 15 years.  Have at least a 30-pack-year history of  smoking. A pack year is smoking an average of one pack of cigarettes a day for 1 year.  Yearly screening should continue until it has been 15 years since you quit.  Yearly screening should stop if you develop a health problem that would prevent you from having lung cancer treatment.  Breast Cancer  Practice breast self-awareness. This means understanding how your breasts normally appear and feel.  It also means doing regular breast self-exams. Let your health care provider know about any changes, no matter how small.  If you are in your 20s or 30s, you should have a clinical breast exam (CBE) by a  health care provider every 1-3 years as part of a regular health exam.  If you are 76 or older, have a CBE every year. Also consider having a breast X-ray (mammogram) every year.  If you have a family history of breast cancer, talk to your health care provider about genetic screening.  If you are at high risk for breast cancer, talk to your health care provider about having an MRI and a mammogram every year.  Breast cancer gene (BRCA) assessment is recommended for women who have family members with BRCA-related cancers. BRCA-related cancers include:  Breast.  Ovarian.  Tubal.  Peritoneal cancers.  Results of the assessment will determine the need for genetic counseling and BRCA1 and BRCA2 testing. Cervical Cancer Your health care provider may recommend that you be screened regularly for cancer of the pelvic organs (ovaries, uterus, and vagina). This screening involves a pelvic examination, including checking for microscopic changes to the surface of your cervix (Pap test). You may be encouraged to have this screening done every 3 years, beginning at age 73.  For women ages 29-65, health care providers may recommend pelvic exams and Pap testing every 3 years, or they may recommend the Pap and pelvic exam, combined with testing for human papilloma virus (HPV), every 5 years. Some types of HPV increase your risk of cervical cancer. Testing for HPV may also be done on women of any age with unclear Pap test results.  Other health care providers may not recommend any screening for nonpregnant women who are considered low risk for pelvic cancer and who do not have symptoms. Ask your health care provider if a screening pelvic exam is right for you.  If you have had past treatment for cervical cancer or a condition that could lead to cancer, you need Pap tests and screening for cancer for at least 20 years after your treatment. If Pap tests have been discontinued, your risk factors (such as having a  new sexual partner) need to be reassessed to determine if screening should resume. Some women have medical problems that increase the chance of getting cervical cancer. In these cases, your health care provider may recommend more frequent screening and Pap tests. Colorectal Cancer  This type of cancer can be detected and often prevented.  Routine colorectal cancer screening usually begins at 51 years of age and continues through 51 years of age.  Your health care provider may recommend screening at an earlier age if you have risk factors for colon cancer.  Your health care provider may also recommend using home test kits to check for hidden blood in the stool.  A small camera at the end of a tube can be used to examine your colon directly (sigmoidoscopy or colonoscopy). This is done to check for the earliest forms of colorectal cancer.  Routine screening usually begins at age 75.  Direct examination of the colon should be repeated every 5-10 years through 51 years of age. However, you may need to be screened more often if early forms of precancerous polyps or small growths are found. Skin Cancer  Check your skin from head to toe regularly.  Tell your health care provider about any new moles or changes in moles, especially if there is a change in a mole's shape or color.  Also tell your health care provider if you have a mole that is larger than the size of a pencil eraser.  Always use sunscreen. Apply sunscreen liberally and repeatedly throughout the day.  Protect yourself by wearing long sleeves, pants, a wide-brimmed hat, and sunglasses whenever you are outside. HEART DISEASE, DIABETES, AND HIGH BLOOD PRESSURE   High blood pressure causes heart disease and increases the risk of stroke. High blood pressure is more likely to develop in:  People who have blood pressure in the high end of the normal range (130-139/85-89 mm Hg).  People who are overweight or obese.  People who are  African American.  If you are 55-63 years of age, have your blood pressure checked every 3-5 years. If you are 71 years of age or older, have your blood pressure checked every year. You should have your blood pressure measured twice--once when you are at a hospital or clinic, and once when you are not at a hospital or clinic. Record the average of the two measurements. To check your blood pressure when you are not at a hospital or clinic, you can use:  An automated blood pressure machine at a pharmacy.  A home blood pressure monitor.  If you are between 77 years and 44 years old, ask your health care provider if you should take aspirin to prevent strokes.  Have regular diabetes screenings. This involves taking a blood sample to check your fasting blood sugar level.  If you are at a normal weight and have a low risk for diabetes, have this test once every three years after 51 years of age.  If you are overweight and have a high risk for diabetes, consider being tested at a younger age or more often. PREVENTING INFECTION  Hepatitis B  If you have a higher risk for hepatitis B, you should be screened for this virus. You are considered at high risk for hepatitis B if:  You were born in a country where hepatitis B is common. Ask your health care provider which countries are considered high risk.  Your parents were born in a high-risk country, and you have not been immunized against hepatitis B (hepatitis B vaccine).  You have HIV or AIDS.  You use needles to inject street drugs.  You live with someone who has hepatitis B.  You have had sex with someone who has hepatitis B.  You get hemodialysis treatment.  You take certain medicines for conditions, including cancer, organ transplantation, and autoimmune conditions. Hepatitis C  Blood testing is recommended for:  Everyone born from [birth year] through 1966.  Anyone with known risk factors for hepatitis C. Sexually transmitted infections  (STIs)  You should be screened for sexually transmitted infections (STIs) including gonorrhea and chlamydia if:  You are sexually active and are younger than 51 years of age.  You are older than 51 years of age and your health care provider tells you that you are at risk for this type of infection.  Your sexual activity has changed since you were last screened and you are  at an increased risk for chlamydia or gonorrhea. Ask your health care provider if you are at risk.  If you do not have HIV, but are at risk, it may be recommended that you take a prescription medicine daily to prevent HIV infection. This is called pre-exposure prophylaxis (PrEP). You are considered at risk if:  You are sexually active and do not regularly use condoms or know the HIV status of your partner(s).  You take drugs by injection.  You are sexually active with a partner who has HIV. Talk with your health care provider about whether you are at high risk of being infected with HIV. If you choose to begin PrEP, you should first be tested for HIV. You should then be tested every 3 months for as long as you are taking PrEP.  PREGNANCY   If you are premenopausal and you may become pregnant, ask your health care provider about preconception counseling.  If you may become pregnant, take 400 to 800 micrograms (mcg) of folic acid every day.  If you want to prevent pregnancy, talk to your health care provider about birth control (contraception). OSTEOPOROSIS AND MENOPAUSE   Osteoporosis is a disease in which the bones lose minerals and strength with aging. This can result in serious bone fractures. Your risk for osteoporosis can be identified using a bone density scan.  If you are 31 years of age or older, or if you are at risk for osteoporosis and fractures, ask your health care provider if you should be screened.  Ask your health care provider whether you should take a calcium or vitamin D supplement to lower your risk  for osteoporosis.  Menopause may have certain physical symptoms and risks.  Hormone replacement therapy may reduce some of these symptoms and risks. Talk to your health care provider about whether hormone replacement therapy is right for you.  HOME CARE INSTRUCTIONS   Schedule regular health, dental, and eye exams.  Stay current with your immunizations.   Do not use any tobacco products including cigarettes, chewing tobacco, or electronic cigarettes.  If you are pregnant, do not drink alcohol.  If you are breastfeeding, limit how much and how often you drink alcohol.  Limit alcohol intake to no more than 1 drink per day for nonpregnant women. One drink equals 12 ounces of beer, 5 ounces of wine, or 1 ounces of hard liquor.  Do not use street drugs.  Do not share needles.  Ask your health care provider for help if you need support or information about quitting drugs.  Tell your health care provider if you often feel depressed.  Tell your health care provider if you have ever been abused or do not feel safe at home.   This information is not intended to replace advice given to you by your health care provider. Make sure you discuss any questions you have with your health care provider.   Document Released: 10/08/2010 Document Revised: 04/15/2014 Document Reviewed: 02/24/2013 Elsevier Interactive Patient Education [employer].

## 2016-04-09 DIAGNOSIS — D0512 Intraductal carcinoma in situ of left breast: Secondary | ICD-10-CM | POA: Diagnosis not present

## 2016-05-15 DIAGNOSIS — Z01419 Encounter for gynecological examination (general) (routine) without abnormal findings: Secondary | ICD-10-CM | POA: Diagnosis not present

## 2016-08-06 ENCOUNTER — Other Ambulatory Visit: Payer: Self-pay

## 2016-12-27 ENCOUNTER — Encounter: Payer: Self-pay | Admitting: Internal Medicine

## 2017-01-27 DIAGNOSIS — R922 Inconclusive mammogram: Secondary | ICD-10-CM | POA: Diagnosis not present

## 2017-01-28 ENCOUNTER — Encounter: Payer: Self-pay | Admitting: Internal Medicine

## 2017-01-28 ENCOUNTER — Ambulatory Visit (INDEPENDENT_AMBULATORY_CARE_PROVIDER_SITE_OTHER): Payer: BLUE CROSS/BLUE SHIELD | Admitting: Internal Medicine

## 2017-01-28 VITALS — BP 122/82 | HR 91 | Temp 98.5°F | Wt 125.2 lb

## 2017-01-28 DIAGNOSIS — J029 Acute pharyngitis, unspecified: Secondary | ICD-10-CM

## 2017-01-28 LAB — POCT RAPID STREP A (OFFICE): Rapid Strep A Screen: NEGATIVE

## 2017-01-28 NOTE — Progress Notes (Signed)
Chief Complaint  Patient presents with  . Sore Throat    Sore throat x 1 day, hurts to swallow. Denies fever, cough or congestion. Some nasal congestion and PND    HPI: Melissa Brown 51 y.o.  SDA  Onset  1 day.   Of sore throat hurts to swallow slight nasal congestion and drainage worried about tonsil infection.  There is no cough or fever no exposure to strep that she is aware of No history of recurrent strep. She got her flu shot 3 weeks ago. No fever.   ROS: See pertinent positives and negatives per HPI.  Past Medical History:  Diagnosis Date  . Allergic    to grass and moldsby skin testing   . Angioedema of lips 12/07/2010   Hx of seem in August 12  remote hx of hives as a child.    . Breast cancer ([place]) 2005   left dcis  . Breast cancer ([place])   . Diabetes, gestational   . GESTATIONAL DIABETES 02/09/2007   Qualifier: History of  By: Regis Bill MD, Standley Brooking   . MVP (mitral valve prolapse)    ANTERIOR LEAFLET, MILD MR  . Palpitations    tachy, supressed with atenolol  . Scoliosis   . Thyroid disease    HYPERTHYROIDISM    Family History  Problem Relation Age of Onset  . Diabetes Mother   . Thyroid disease Mother   . Hypertension Mother   . Diabetes Father   . Heart disease Father   . Diabetes Other     Social History   Social History  . Marital status: Married    Spouse name: N/A  . Number of children: N/A  . Years of education: N/A   Social History Main Topics  . Smoking status: Never Smoker  . Smokeless tobacco: Never Used  . Alcohol use 0.5 oz/week    1 Standard drinks or equivalent per week  . Drug use: No  . Sexual activity: Not Asked   Other Topics Concern  . 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 betamethasone dipropionate (DIPROLENE) 0.05 % cream Apply topically 2 (two) times daily. To affected area; not on face 15 g 2  . cetirizine (ZYRTEC) 10 MG tablet Take 10 mg by mouth.    . Cholecalciferol (VITAMIN D-3 PO) Take by mouth.    . ferrous sulfate 325 (65 FE) MG tablet Take 325 mg by mouth daily with breakfast. During period    . fluocinonide-emollient (LIDEX-E) 0.05 % cream Apply 1 application topically 2 (two) times daily. Not on face 30 g 1  . Multiple Vitamins-Calcium (ONE-A-DAY WOMENS PO) Take by mouth daily.       No facility-administered medications prior to visit.      EXAM:  BP 122/82 (BP Location: Right Arm, Patient Position: Sitting, Cuff Size: Normal)   Pulse 91   Temp 98.5 F (36.9 C) (Oral)   Wt 125 lb 3.2 oz (56.8 kg)   BMI 24.45 kg/m   Body mass index is 24.45 kg/m.  GENERAL: vitals reviewed and listed above, alert, oriented, appears well  hydrated and in no acute distress she has mild nasal congestion no cough no acute distress and nontoxic HEENT: atraumatic, conjunctiva  clear, no obvious abnormalities on inspection of external nose and ears right TM and partly occluded normal gray left TM is normal OP : no lesion edema or exudate +2 erythema no petechiae good airway NECK: no obvious masses on inspection palpation tender shotty AC nodes negative PC LUNGS: clear to auscultation bilaterally, no wheezes, rales or rhonchi, good air movement CV: HRRR,  MS: moves all extremities without noticeable focal  abnormality  ASSESSMENT AND PLAN:  Discussed the following assessment and plan:  Pharyngitis, unspecified etiology - Plan: POCT rapid strep A, Culture, Group A Strep   Expectant management.  -Patient advised to return or notify health care team  if symptoms worsen ,persist or new concerns arise.  Patient Instructions  This is most likely a viral sore throat infection pharyngitis that will resolve on its own.  However as the sore  throat improved  may end up with more congestion and a cough. The whole illness may take 1-2 weeks. Let us know if you have significant fever relapsing symptoms or other concerns. Otherwise treatment is symptomatic until your body heals. Rest fluids etc    Pharyngitis Pharyngitis is redness, pain, and swelling (inflammation) of your pharynx. What are the causes? Pharyngitis is usually caused by infection. Most of the time, these infections are from viruses (viral) and are part of a cold. However, sometimes pharyngitis is caused by bacteria (bacterial). Pharyngitis can also be caused by allergies. Viral pharyngitis may be spread from person to person by coughing, sneezing, and personal items or utensils (cups, forks, spoons, toothbrushes). Bacterial pharyngitis may be spread from person to person by more intimate contact, such as kissing. What are the signs or symptoms? Symptoms of pharyngitis include:  Sore throat.  Tiredness (fatigue).  Low-grade fever.  Headache.  Joint pain and muscle aches.  Skin rashes.  Swollen lymph nodes.  Plaque-like film on throat or tonsils (often seen with bacterial pharyngitis).  How is this diagnosed? Your health care provider will ask you questions about your illness and your symptoms. Your medical history, along with a physical exam, is often all that is needed to diagnose pharyngitis. Sometimes, a rapid strep test is done. Other lab tests may also be done, depending on the suspected cause. How is this treated? Viral pharyngitis will usually get better in 3-4 days without the use of medicine. Bacterial pharyngitis is treated with medicines that kill germs (antibiotics). Follow these instructions at home:  Drink enough water and fluids to keep your urine clear or pale yellow.  Only take over-the-counter or prescription medicines as directed by your health care provider: ? If you are prescribed antibiotics, make sure you finish them even if you  start to feel better. ? Do not take aspirin.  Get lots of rest.  Gargle with 8 oz of salt water ( tsp of salt per 1 qt of water) as often as every 1-2 hours to soothe your throat.  Throat lozenges (if you are not at risk for choking) or sprays may be used to soothe your throat. Contact a health care provider if:  You have large, tender lumps in your neck.  You have a rash.  You cough up green, yellow-brown, or bloody spit. Get help right away if:  Your neck becomes stiff.  You drool or are unable to swallow liquids.  You vomit or are unable to keep medicines or  liquids down.  You have severe pain that does not go away with the use of recommended medicines.  You have trouble breathing (not caused by a stuffy nose). This information is not intended to replace advice given to you by your health care provider. Make sure you discuss any questions you have with your health care provider. Document Released: 03/25/2005 Document Revised: 08/31/2015 Document Reviewed: 11/30/2012 Elsevier Interactive Patient Education  2017 Dennison K. Panosh M.D.

## 2017-01-28 NOTE — Patient Instructions (Addendum)
This is most likely a viral sore throat infection pharyngitis that will resolve on its own.  However as the sore throat improved  may end up with more congestion and a cough. The whole illness may take 1-2 weeks. Let us know if you have significant fever relapsing symptoms or other concerns. Otherwise treatment is symptomatic until your body heals. Rest fluids etc    Pharyngitis Pharyngitis is redness, pain, and swelling (inflammation) of your pharynx. What are the causes? Pharyngitis is usually caused by infection. Most of the time, these infections are from viruses (viral) and are part of a cold. However, sometimes pharyngitis is caused by bacteria (bacterial). Pharyngitis can also be caused by allergies. Viral pharyngitis may be spread from person to person by coughing, sneezing, and personal items or utensils (cups, forks, spoons, toothbrushes). Bacterial pharyngitis may be spread from person to person by more intimate contact, such as kissing. What are the signs or symptoms? Symptoms of pharyngitis include:  Sore throat.  Tiredness (fatigue).  Low-grade fever.  Headache.  Joint pain and muscle aches.  Skin rashes.  Swollen lymph nodes.  Plaque-like film on throat or tonsils (often seen with bacterial pharyngitis).  How is this diagnosed? Your health care provider will ask you questions about your illness and your symptoms. Your medical history, along with a physical exam, is often all that is needed to diagnose pharyngitis. Sometimes, a rapid strep test is done. Other lab tests may also be done, depending on the suspected cause. How is this treated? Viral pharyngitis will usually get better in 3-4 days without the use of medicine. Bacterial pharyngitis is treated with medicines that kill germs (antibiotics). Follow these instructions at home:  Drink enough water and fluids to keep your urine clear or pale yellow.  Only take over-the-counter or prescription medicines as  directed by your health care provider: ? If you are prescribed antibiotics, make sure you finish them even if you start to feel better. ? Do not take aspirin.  Get lots of rest.  Gargle with 8 oz of salt water ( tsp of salt per 1 qt of water) as often as every 1-2 hours to soothe your throat.  Throat lozenges (if you are not at risk for choking) or sprays may be used to soothe your throat. Contact a health care provider if:  You have large, tender lumps in your neck.  You have a rash.  You cough up green, yellow-brown, or bloody spit. Get help right away if:  Your neck becomes stiff.  You drool or are unable to swallow liquids.  You vomit or are unable to keep medicines or liquids down.  You have severe pain that does not go away with the use of recommended medicines.  You have trouble breathing (not caused by a stuffy nose). This information is not intended to replace advice given to you by your health care provider. Make sure you discuss any questions you have with your health care provider. Document Released: 03/25/2005 Document Revised: 08/31/2015 Document Reviewed: 11/30/2012 Elsevier Interactive Patient Education  2017 [employer].

## 2017-01-30 LAB — CULTURE, GROUP A STREP
MICRO NUMBER:: 81184513
SPECIMEN QUALITY: ADEQUATE

## 2017-01-31 ENCOUNTER — Other Ambulatory Visit: Payer: BLUE CROSS/BLUE SHIELD

## 2017-02-06 NOTE — Progress Notes (Signed)
Chief Complaint  Patient presents with  . Annual Exam    Pt still having mucus production with cough.     HPI: Patient  Melissa Brown  50 y.o. comes in today for [place] visit and recovering from her respiratory condition however still has a good bit of mucus S about postnasal drainage sinus congestion and other treatments. No fever or shaking chills.  No shortness of breath. She may get congestion and sneezing S in this fall.  But no diagnosed allergy.  Does not have a family history of colon cancer in the first-degree and no symptoms but needs colon cancer screening. Sees breast specialist at [facility]..   Health Maintenance  Topic Date Due  . PNEUMOCOCCAL POLYSACCHARIDE VACCINE (1) 03/09/1967  . FOOT EXAM  03/09/1975  . HIV Screening  03/08/1980  . URINE MICROALBUMIN  01/15/2014  . COLONOSCOPY  03/09/2015  . TETANUS/TDAP  07/07/2016  . OPHTHALMOLOGY EXAM  07/29/2016  . HEMOGLOBIN A1C  08/05/2016  . MAMMOGRAM  01/10/2018  . PAP SMEAR  02/07/2019  . INFLUENZA VACCINE  Completed   Health Maintenance Review LIFESTYLE:  Exercise:    Hs fit bit  ocass  Tobacco/ETS: no Alcohol:   1-2 per wek  Sugar beverages: no Sleep:7-8  Drug use: no HH of  3  Dog  Work: 40n - 45      ROS:  GEN/ HEENT: No fever, significant weight changes sweats headaches vision problems hearing changes, CV/ PULM; No chest pain shortness of breath , syncope,edema  change in exercise tolerance. GI /GU: No adominal pain, vomiting, change in bowel habits. No blood in the stool. No significant GU symptoms. SKIN/HEME: ,no acute skin rashes suspicious lesions or bleeding. No lymphadenopathy, nodules, masses.  NEURO/ PSYCH:  No neurologic signs such as weakness numbness. No depression anxiety. IMM/ Allergy: No unusual infections.  Allergy .   REST of 12 system review negative except as per HPI   Past Medical History:  Diagnosis Date  . Allergic    to grass and moldsby skin  testing   . Angioedema of lips 12/07/2010   Hx of seem in August 12  remote hx of hives as a child.    . Breast cancer ([place]) 2005   left dcis  . Breast cancer ([place])   . Diabetes, gestational   . GESTATIONAL DIABETES 02/09/2007   Qualifier: History of  By: Regis Bill MD, Standley Brooking   . MVP (mitral valve prolapse)    ANTERIOR LEAFLET, MILD MR  . Palpitations    tachy, supressed with atenolol  . Scoliosis   . Thyroid disease    HYPERTHYROIDISM    Past Surgical History:  Procedure Laterality Date  . BREAST LUMPECTOMY     radiation DCIS- reconstruction left  . CESAREAN SECTION      Family History  Problem Relation Age of Onset  . Diabetes Mother   . Thyroid disease Mother   . Hypertension Mother   . Diabetes Father   . Heart disease Father   . Diabetes Other     Social History   Social History  . Marital status: Married    Spouse name: N/A  . Number of children: N/A  . Years of education: N/A   Social History Main Topics  . Smoking status: Never Smoker  . Smokeless tobacco: Never Used  . Alcohol use 0.5 oz/week    1 Standard drinks or equivalent per week  . Drug use: No  . Sexual activity: Not  Asked   Other Topics Concern  . 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 cetirizine (ZYRTEC) 10 MG tablet Take 10 mg by mouth.    . Cholecalciferol (VITAMIN D-3 PO) Take by mouth.    . ferrous sulfate 325 (65 FE) MG tablet Take 325 mg by mouth daily with breakfast. During period    . fluocinonide-emollient (LIDEX-E) 0.05 % cream Apply 1 application topically 2 (two) times daily. Not on face 30 g 1  . Multiple Vitamins-Calcium (ONE-A-DAY WOMENS PO) Take by mouth daily.      . betamethasone dipropionate  (DIPROLENE) 0.05 % cream Apply topically 2 (two) times daily. To affected area; not on face (Patient not taking: Reported on 02/07/2017) 15 g 2   No facility-administered medications prior to visit.      EXAM:  BP 128/86 (BP Location: Right Arm, Patient Position: Sitting, Cuff Size: Normal)   Pulse 91   Temp 98.2 F (36.8 C) (Oral)   Ht 4' 11.5" (1.511 m)   Wt 124 lb 9.6 oz (56.5 kg)   LMP 11/29/2016 (Exact Date)   BMI 24.74 kg/m   Body mass index is 24.74 kg/m. Wt Readings from Last 3 Encounters:  02/07/17 124 lb 9.6 oz (56.5 kg)  01/28/17 125 lb 3.2 oz (56.8 kg)  02/05/16 130 lb 6.4 oz (59.1 kg)    Physical Exam: Vital signs reviewed WLN:LGXQ is a well-developed well-nourished alert cooperative    who appearsr stated age in no acute distress.  Has some mild to moderate congestion but nontoxic. HEENT: normocephalic atraumatic , Eyes: PERRL EOM's full, conjunctiva clear, Nares: paten,t no deformity congestedno or tenderness., Ears: no deformity EAC's clear TMs with normal landmarks. Mouth: clear OP, no lesions, edema.  Moist mucous membranes. Dentition in adequate repair. NECK: supple without masses, thyromegaly or bruits. CHEST/PULM:  Clear to auscultation and percussion breath sounds equal no wheeze , rales or rhonchi. No chest wall deformities or tenderness. Breast: Left breast reconstructed right no obvious masses axilla is clear. CV: PMI is nondisplaced, S1 S2 no gallops, murmurs, rubs. Peripheral pulses are full without delay.No JVD .  ABDOMEN: Bowel sounds normal nontender  No guard or rebound, no hepato splenomegal no CVA tenderness.  . Extremtities:  No clubbing cyanosis or edema, no acute joint swelling or redness no focal atrophy NEURO:  Oriented x3, cranial nerves 3-12 appear to be intact, no obvious focal weakness,gait within normal limits no abnormal reflexes or asymmetrical feet show no neuropathy monofilament testing. SKIN: No acute rashes normal turgor, color, no  bruising or petechiae. PSYCH: Oriented, good eye contact, no obvious depression anxiety, cognition and judgment appear normal. LN: no cervical axillary inguinal adenopathy  Lab Results  Component Value Date   WBC 4.9 02/07/2017   HGB 14.0 02/07/2017   HCT 42.1 02/07/2017   PLT 297.0 02/07/2017   GLUCOSE 112 (H) 02/07/2017   CHOL 217 (H) 02/07/2017   TRIG 154.0 (H) 02/07/2017   HDL 45.00 02/07/2017   LDLDIRECT 166.0 01/29/2016   LDLCALC 142 (H) 02/07/2017   ALT 15 02/07/2017   AST 15 02/07/2017   NA  137 02/07/2017   K 4.3 02/07/2017   CL 102 02/07/2017   CREATININE 0.64 02/07/2017   BUN 11 02/07/2017   CO2 29 02/07/2017   TSH 1.20 01/29/2016   HGBA1C 6.1 02/07/2017   MICROALBUR 0.5 01/15/2013    BP Readings from Last 3 Encounters:  02/07/17 128/86  01/28/17 122/82  02/05/16 (!) 138/94    Lab results reviewed with patient   ASSESSMENT AND PLAN:  Discussed the following assessment and plan:  Visit for preventive health examination - Plan: Basic metabolic panel, CBC with Differential/Platelet, Hemoglobin A1c, Hepatic function panel, Lipid panel  Cough, persistent - Plan: Basic metabolic panel, CBC with Differential/Platelet, Hemoglobin A1c, Hepatic function panel, Lipid panel  Hyperlipidemia, unspecified hyperlipidemia type - Plan: Basic metabolic panel, CBC with Differential/Platelet, Hemoglobin A1c, Hepatic function panel, Lipid panel  Post-nasal drainage  HX: breast cancer - Plan: Basic metabolic panel, CBC with Differential/Platelet, Hemoglobin A1c, Hepatic function panel, Lipid panel  Hyperglycemia - Plan: Basic metabolic panel, CBC with Differential/Platelet, Hemoglobin A1c, Hepatic function panel, Lipid panel  Colon cancer screening - Reviewed options will contact us about name of referral for colonoscopy screening. Discussed shingles vaccine she can be put on the list. Patient Care Team: Whitney Hillegass, Standley Brooking, MD as PCP - General Dorien Chihuahua, MD as Referring  Physician (Obstetrics and Gynecology) Lindon Romp, MD as Consulting Physician (Internal Medicine) Patient Instructions  Your chest exam is clear residual cough could be from postnasal drainage and sinus congestion. Begin nasal cortisone such as Flonase or Nasacort use every day for at least 2 weeks and then continue if helpful. You can also try chlorpheniramine at night which is an older antihistamine it could cause drowsiness but does relieve some symptoms of cough temporarily.  If you get fever increasing pain shortness of breath or worsening or not improved after another 2-4 weeks contact us for advice.  We will let you know lab results when available. Let us know about referral clinician for colonoscopy screening.    Preventive Care 40-64 Years, Female Preventive care refers to lifestyle choices and visits with your health care provider that can promote health and wellness. What does preventive care include?  A yearly physical exam. This is also called an annual well check.  Dental exams once or twice a year.  Routine eye exams. Ask your health care provider how often you should have your eyes checked.  Personal lifestyle choices, including: ? Daily care of your teeth and gums. ? Regular physical activity. ? Eating a healthy diet. ? Avoiding tobacco and drug use. ? Limiting alcohol use. ? Practicing safe sex. ? Taking low-dose aspirin daily starting at age 30. ? Taking vitamin and mineral supplements as recommended by your health care provider. What happens during an annual well check? The services and screenings done by your health care provider during your annual well check will depend on your age, overall health, lifestyle risk factors, and family history of disease. Counseling Your health care provider may ask you questions about your:  Alcohol use.  Tobacco use.  Drug use.  Emotional well-being.  Home and relationship well-being.  Sexual  activity.  Eating habits.  Work and work Statistician.  Method of birth control.  Menstrual cycle.  Pregnancy history.  Screening You may have the following tests or measurements:  Height, weight, and BMI.  Blood pressure.  Lipid and cholesterol levels. These may be checked every 5 years, or more frequently if you are over 3 years old.  Skin check.  Lung cancer screening. You may have this screening every year starting at age 51 if you have a 30-pack-year history of smoking and currently smoke or have quit within the past 15 years.  Fecal occult blood test (FOBT) of the stool. You may have this test every year starting at age 2.  Flexible sigmoidoscopy or colonoscopy. You may have a sigmoidoscopy every 5 years or a colonoscopy every 10 years starting at age 65.  Hepatitis C blood test.  Hepatitis B blood test.  Sexually transmitted disease (STD) testing.  Diabetes screening. This is done by checking your blood sugar (glucose) after you have not eaten for a while (fasting). You may have this done every 1-3 years.  Mammogram. This may be done every 1-2 years. Talk to your health care provider about when you should start having regular mammograms. This may depend on whether you have a family history of breast cancer.  BRCA-related cancer screening. This may be done if you have a family history of breast, ovarian, tubal, or peritoneal cancers.  Pelvic exam and Pap test. This may be done every 3 years starting at age 62. Starting at age 35, this may be done every 5 years if you have a Pap test in combination with an HPV test.  Bone density scan. This is done to screen for osteoporosis. You may have this scan if you are at high risk for osteoporosis.  Discuss your test results, treatment options, and if necessary, the need for more tests with your health care provider. Vaccines Your health care provider may recommend certain vaccines, such as:  Influenza vaccine. This is  recommended every year.  Tetanus, diphtheria, and acellular pertussis (Tdap, Td) vaccine. You may need a Td booster every 10 years.  Varicella vaccine. You may need this if you have not been vaccinated.  Zoster vaccine. You may need this after age 21.  Measles, mumps, and rubella (MMR) vaccine. You may need at least one dose of MMR if you were born in 1957 or later. You may also need a second dose.  Pneumococcal 13-valent conjugate (PCV13) vaccine. You may need this if you have certain conditions and were not previously vaccinated.  Pneumococcal polysaccharide (PPSV23) vaccine. You may need one or two doses if you smoke cigarettes or if you have certain conditions.  Meningococcal vaccine. You may need this if you have certain conditions.  Hepatitis A vaccine. You may need this if you have certain conditions or if you travel or work in places where you may be exposed to hepatitis A.  Hepatitis B vaccine. You may need this if you have certain conditions or if you travel or work in places where you may be exposed to hepatitis B.  Haemophilus influenzae type b (Hib) vaccine. You may need this if you have certain conditions.  Talk to your health care provider about which screenings and vaccines you need and how often you need them. This information is not intended to replace advice given to you by your health care provider. Make sure you discuss any questions you have with your health care provider. Document Released: 04/21/2015 Document Revised: 12/13/2015 Document Reviewed: 01/24/2015 Elsevier Interactive Patient Education  2017 [place]. [place] Liz M.D.

## 2017-02-07 ENCOUNTER — Encounter: Payer: Self-pay | Admitting: Internal Medicine

## 2017-02-07 ENCOUNTER — Ambulatory Visit (INDEPENDENT_AMBULATORY_CARE_PROVIDER_SITE_OTHER): Payer: BLUE CROSS/BLUE SHIELD | Admitting: Internal Medicine

## 2017-02-07 VITALS — BP 128/86 | HR 91 | Temp 98.2°F | Ht 59.5 in | Wt 124.6 lb

## 2017-02-07 DIAGNOSIS — Z1211 Encounter for screening for malignant neoplasm of colon: Secondary | ICD-10-CM

## 2017-02-07 DIAGNOSIS — R05 Cough: Secondary | ICD-10-CM

## 2017-02-07 DIAGNOSIS — R0982 Postnasal drip: Secondary | ICD-10-CM | POA: Diagnosis not present

## 2017-02-07 DIAGNOSIS — Z0000 Encounter for general adult medical examination without abnormal findings: Secondary | ICD-10-CM

## 2017-02-07 DIAGNOSIS — R739 Hyperglycemia, unspecified: Secondary | ICD-10-CM

## 2017-02-07 DIAGNOSIS — Z853 Personal history of malignant neoplasm of breast: Secondary | ICD-10-CM | POA: Diagnosis not present

## 2017-02-07 DIAGNOSIS — E785 Hyperlipidemia, unspecified: Secondary | ICD-10-CM | POA: Diagnosis not present

## 2017-02-07 DIAGNOSIS — R053 Chronic cough: Secondary | ICD-10-CM

## 2017-02-07 LAB — HEPATIC FUNCTION PANEL
ALBUMIN: 4.3 g/dL (ref 3.5–5.2)
ALT: 15 U/L (ref 0–35)
AST: 15 U/L (ref 0–37)
Alkaline Phosphatase: 58 U/L (ref 39–117)
Bilirubin, Direct: 0.1 mg/dL (ref 0.0–0.3)
TOTAL PROTEIN: 7.7 g/dL (ref 6.0–8.3)
Total Bilirubin: 0.4 mg/dL (ref 0.2–1.2)

## 2017-02-07 LAB — CBC WITH DIFFERENTIAL/PLATELET
BASOS PCT: 0.7 % (ref 0.0–3.0)
Basophils Absolute: 0 10*3/uL (ref 0.0–0.1)
EOS PCT: 5.2 % — AB (ref 0.0–5.0)
Eosinophils Absolute: 0.3 10*3/uL (ref 0.0–0.7)
HCT: 42.1 % (ref 36.0–46.0)
Hemoglobin: 14 g/dL (ref 12.0–15.0)
LYMPHS ABS: 1.2 10*3/uL (ref 0.7–4.0)
Lymphocytes Relative: 23.5 % (ref 12.0–46.0)
MCHC: 33.4 g/dL (ref 30.0–36.0)
MCV: 93.9 fl (ref 78.0–100.0)
Monocytes Absolute: 0.3 10*3/uL (ref 0.1–1.0)
Monocytes Relative: 6.7 % (ref 3.0–12.0)
NEUTROS ABS: 3.2 10*3/uL (ref 1.4–7.7)
NEUTROS PCT: 63.9 % (ref 43.0–77.0)
PLATELETS: 297 10*3/uL (ref 150.0–400.0)
RBC: 4.48 Mil/uL (ref 3.87–5.11)
RDW: 12.5 % (ref 11.5–15.5)
WBC: 4.9 10*3/uL (ref 4.0–10.5)

## 2017-02-07 LAB — HEMOGLOBIN A1C: HEMOGLOBIN A1C: 6.1 % (ref 4.6–6.5)

## 2017-02-07 LAB — BASIC METABOLIC PANEL
BUN: 11 mg/dL (ref 6–23)
CHLORIDE: 102 meq/L (ref 96–112)
CO2: 29 meq/L (ref 19–32)
Calcium: 9.5 mg/dL (ref 8.4–10.5)
Creatinine, Ser: 0.64 mg/dL (ref 0.40–1.20)
GFR: 103.6 mL/min (ref >60.00)
GLUCOSE: 112 mg/dL — AB (ref 70–99)
POTASSIUM: 4.3 meq/L (ref 3.5–5.1)
Sodium: 137 mEq/L (ref 135–145)

## 2017-02-07 LAB — LIPID PANEL
CHOLESTEROL: 217 mg/dL — AB (ref 0–200)
HDL: 45 mg/dL (ref >39.00)
LDL Cholesterol: 142 mg/dL — ABNORMAL HIGH (ref 0–99)
NonHDL: 172.37
Total CHOL/HDL Ratio: 5
Triglycerides: 154 mg/dL — ABNORMAL HIGH (ref 0.0–149.0)
VLDL: 30.8 mg/dL (ref 0.0–40.0)

## 2017-02-07 NOTE — Patient Instructions (Addendum)
Your chest exam is clear residual cough could be from postnasal drainage and sinus congestion. Begin nasal cortisone such as Flonase or Nasacort use every day for at least 2 weeks and then continue if helpful. You can also try chlorpheniramine at night which is an older antihistamine it could cause drowsiness but does relieve some symptoms of cough temporarily.  If you get fever increasing pain shortness of breath or worsening or not improved after another 2-4 weeks contact us for advice.  We will let you know lab results when available. Let us know about referral clinician for colonoscopy screening.    Preventive Care 40-64 Years, Female Preventive care refers to lifestyle choices and visits with your health care provider that can promote health and wellness. What does preventive care include?  A yearly physical exam. This is also called an annual well check.  Dental exams once or twice a year.  Routine eye exams. Ask your health care provider how often you should have your eyes checked.  Personal lifestyle choices, including: ? Daily care of your teeth and gums. ? Regular physical activity. ? Eating a healthy diet. ? Avoiding tobacco and drug use. ? Limiting alcohol use. ? Practicing safe sex. ? Taking low-dose aspirin daily starting at age 35. ? Taking vitamin and mineral supplements as recommended by your health care provider. What happens during an annual well check? The services and screenings done by your health care provider during your annual well check will depend on your age, overall health, lifestyle risk factors, and family history of disease. Counseling Your health care provider may ask you questions about your:  Alcohol use.  Tobacco use.  Drug use.  Emotional well-being.  Home and relationship well-being.  Sexual activity.  Eating habits.  Work and work Statistician.  Method of birth control.  Menstrual cycle.  Pregnancy history.  Screening You  may have the following tests or measurements:  Height, weight, and BMI.  Blood pressure.  Lipid and cholesterol levels. These may be checked every 5 years, or more frequently if you are over 25 years old.  Skin check.  Lung cancer screening. You may have this screening every year starting at age 86 if you have a 30-pack-year history of smoking and currently smoke or have quit within the past 15 years.  Fecal occult blood test (FOBT) of the stool. You may have this test every year starting at age 13.  Flexible sigmoidoscopy or colonoscopy. You may have a sigmoidoscopy every 5 years or a colonoscopy every 10 years starting at age 10.  Hepatitis C blood test.  Hepatitis B blood test.  Sexually transmitted disease (STD) testing.  Diabetes screening. This is done by checking your blood sugar (glucose) after you have not eaten for a while (fasting). You may have this done every 1-3 years.  Mammogram. This may be done every 1-2 years. Talk to your health care provider about when you should start having regular mammograms. This may depend on whether you have a family history of breast cancer.  BRCA-related cancer screening. This may be done if you have a family history of breast, ovarian, tubal, or peritoneal cancers.  Pelvic exam and Pap test. This may be done every 3 years starting at age 21. Starting at age 51, this may be done every 5 years if you have a Pap test in combination with an HPV test.  Bone density scan. This is done to screen for osteoporosis. You may have this scan if you are at  high risk for osteoporosis.  Discuss your test results, treatment options, and if necessary, the need for more tests with your health care provider. Vaccines Your health care provider may recommend certain vaccines, such as:  Influenza vaccine. This is recommended every year.  Tetanus, diphtheria, and acellular pertussis (Tdap, Td) vaccine. You may need a Td booster every 10 years.  Varicella  vaccine. You may need this if you have not been vaccinated.  Zoster vaccine. You may need this after age 53.  Measles, mumps, and rubella (MMR) vaccine. You may need at least one dose of MMR if you were born in 1957 or later. You may also need a second dose.  Pneumococcal 13-valent conjugate (PCV13) vaccine. You may need this if you have certain conditions and were not previously vaccinated.  Pneumococcal polysaccharide (PPSV23) vaccine. You may need one or two doses if you smoke cigarettes or if you have certain conditions.  Meningococcal vaccine. You may need this if you have certain conditions.  Hepatitis A vaccine. You may need this if you have certain conditions or if you travel or work in places where you may be exposed to hepatitis A.  Hepatitis B vaccine. You may need this if you have certain conditions or if you travel or work in places where you may be exposed to hepatitis B.  Haemophilus influenzae type b (Hib) vaccine. You may need this if you have certain conditions.  Talk to your health care provider about which screenings and vaccines you need and how often you need them. This information is not intended to replace advice given to you by your health care provider. Make sure you discuss any questions you have with your health care provider. Document Released: 04/21/2015 Document Revised: 12/13/2015 Document Reviewed: 01/24/2015 Elsevier Interactive Patient Education  2017 [employer].

## 2017-05-21 DIAGNOSIS — D251 Intramural leiomyoma of uterus: Secondary | ICD-10-CM | POA: Diagnosis not present

## 2017-05-21 DIAGNOSIS — Z1211 Encounter for screening for malignant neoplasm of colon: Secondary | ICD-10-CM | POA: Diagnosis not present

## 2017-06-09 DIAGNOSIS — D251 Intramural leiomyoma of uterus: Secondary | ICD-10-CM | POA: Diagnosis not present

## 2017-06-09 DIAGNOSIS — R399 Unspecified symptoms and signs involving the genitourinary system: Secondary | ICD-10-CM | POA: Diagnosis not present

## 2017-09-23 DIAGNOSIS — Z1211 Encounter for screening for malignant neoplasm of colon: Secondary | ICD-10-CM | POA: Diagnosis not present

## 2017-10-26 ENCOUNTER — Encounter: Payer: Self-pay | Admitting: Internal Medicine

## 2017-10-27 NOTE — Telephone Encounter (Signed)
Please advise Dr Panosh, thanks.   

## 2017-11-02 ENCOUNTER — Encounter: Payer: Self-pay | Admitting: Internal Medicine

## 2017-11-03 NOTE — Telephone Encounter (Signed)
Pt requesting Rx. Please advise

## 2017-11-03 NOTE — Telephone Encounter (Signed)
The prescribed ointment is class 2 potency and the highest class without  Restriction  ( class one has  Time limitation use )   If needs   More potent would need Ov to advise and prescribe.   Other wise can refill previous ointment

## 2017-11-03 NOTE — Telephone Encounter (Signed)
Duplicate email See 1/36/43 email

## 2017-11-11 MED ORDER — FLUOCINONIDE-E 0.05 % EX CREA: 1.0000 "application " | TOPICAL_CREAM | Freq: Two times a day (BID) | CUTANEOUS | 3 refills | Status: DC

## 2017-11-11 NOTE — Telephone Encounter (Signed)
Rx sent as it is on current med list for fluocinonide-emollient (LIDEX-E) 0.05 % cream Pt notified.  Nothing further needed.

## 2017-11-17 ENCOUNTER — Encounter: Payer: Self-pay | Admitting: Internal Medicine

## 2017-11-17 ENCOUNTER — Ambulatory Visit: Payer: BLUE CROSS/BLUE SHIELD | Admitting: Internal Medicine

## 2017-11-17 ENCOUNTER — Ambulatory Visit (INDEPENDENT_AMBULATORY_CARE_PROVIDER_SITE_OTHER): Payer: BLUE CROSS/BLUE SHIELD

## 2017-11-17 VITALS — BP 142/80 | HR 90 | Temp 99.4°F | Wt 127.9 lb

## 2017-11-17 DIAGNOSIS — J3489 Other specified disorders of nose and nasal sinuses: Secondary | ICD-10-CM

## 2017-11-17 DIAGNOSIS — R058 Other specified cough: Secondary | ICD-10-CM

## 2017-11-17 DIAGNOSIS — J329 Chronic sinusitis, unspecified: Secondary | ICD-10-CM | POA: Diagnosis not present

## 2017-11-17 DIAGNOSIS — R05 Cough: Secondary | ICD-10-CM

## 2017-11-17 DIAGNOSIS — J309 Allergic rhinitis, unspecified: Secondary | ICD-10-CM | POA: Diagnosis not present

## 2017-11-17 MED ORDER — PREDNISONE 20 MG PO TABS: 20.0000 mg | ORAL_TABLET | Freq: Two times a day (BID) | ORAL | 0 refills | Status: DC

## 2017-11-17 MED ORDER — MONTELUKAST SODIUM 10 MG PO TABS: 10.0000 mg | ORAL_TABLET | Freq: Every day | ORAL | 3 refills | Status: DC

## 2017-11-17 MED ORDER — DOXYCYCLINE HYCLATE 100 MG PO TABS: 100.0000 mg | ORAL_TABLET | Freq: Two times a day (BID) | ORAL | 0 refills | Status: DC

## 2017-11-17 NOTE — Patient Instructions (Addendum)
  Chest exam is reassuring . AND X RAY NO PNEUMONIA  Ut could be consistent with allergy asthma   Sounds like   Sinus inflammation  And drainage causing the cough . Sometimes this is from allergy but if low grade fever and getting worse treating for infection  Also .   Antibiotic and short  Course of prednisone ( can take just 3 days )  And then can begin  singular for allergy sx suppression Stay on the Flonase and saline nose spray also .    lareg local reaction to the  shingrix is not rare and hsould subside .   Advise  Fu in 2-3 weeks if not better  Or as needed  If singular helps then can continue for  Seasonal  Suppression of   Allergies .

## 2017-11-17 NOTE — Progress Notes (Signed)
Chief Complaint  Patient presents with  . Nasal Congestion    PND x 3-4 weeks , clogging throat and patient to try and clear out mucous so she can breathe and quit coughing. Mucous is clear and white in color, at times foamy looking. Low grade temp. Pt has been using Flonase and Alletec. Pt had shingles vaccine over the weekend - not sure if temp is from the vaccine?   Marland Kitchen Hypersensitivity Reaction    Possible reaction to Shingrix vaccine - golfball size knot on right arm, very tender with palpating and hot to the touch. Pt had vaccine on Saturday 11/15/17. Area was discovered when trying to check BP and patient yelled in pain when placing cuff on arm.     HPI: Melissa Brown 52 y.o.   SDA    For   Post nasal drainge  And  SPitting out phlegm and now clear  White some foamy and getting  worse at night .  Awakening to clear throat.  No sob  But wants to make  chest lungs is ok.  Took allertag and flonase eabout 2 weeks  Still getting worse  cough night nasal congestion and pressure   Had  shingrix vaccine and right arm  Large and red   Hs hx of  Allergies  Some inhalant     ROS: See pertinent positives and negatives per HPI.? Low grade  Temp  No hx of sathma?  Past Medical History:  Diagnosis Date  . Allergic    to grass and moldsby skin testing   . Angioedema of lips 12/07/2010   Hx of seem in August 12  remote hx of hives as a child.    . Breast cancer ([place]) 2005   left dcis  . Breast cancer ([place])   . Diabetes, gestational   . GESTATIONAL DIABETES 02/09/2007   Qualifier: History of  By: Regis Bill MD, Standley Brooking   . MVP (mitral valve prolapse)    ANTERIOR LEAFLET, MILD MR  . Palpitations    tachy, supressed with atenolol  . Scoliosis   . Thyroid disease    HYPERTHYROIDISM    Family History  Problem Relation Age of Onset  . Diabetes Mother   . Thyroid disease Mother   . Hypertension Mother   . Diabetes Father   . Heart disease Father   . Diabetes Other     Social History    Socioeconomic History  . Marital status: Married    Spouse name: Not on file  . Number of children: Not on file  . Years of education: Not on file  . Highest education level: Not on file  Occupational History  . Not on file  Social Needs  . Financial resource strain: Not on file  . Food insecurity:    Worry: Not on file    Inability: Not on file  . Transportation needs:    Medical: Not on file    Non-medical: Not on file  Tobacco Use  . Smoking status: Never Smoker  . Smokeless tobacco: Never Used  Substance and Sexual Activity  . Alcohol use: Yes    Alcohol/week: 1.0 standard drinks    Types: 1 Standard drinks or equivalent per week  . Drug use: No  . Sexual activity: Not on file  Lifestyle  . Physical activity:    Days per week: Not on file    Minutes per session: Not on file  . Stress: Not on file  Relationships  . Social  connections:    Talks on phone: Not on file    Gets together: Not on file    Attends religious service: Not on file    Active member of club or organization: Not on file    Attends meetings of clubs or organizations: Not on file    Relationship status: Not on file  Other Topics Concern  . 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 cetirizine (ZYRTEC) 10 MG tablet Allertec ([employer] Brand) 10mg  daily    . Cholecalciferol (VITAMIN D-3 PO) Take by mouth.    . ferrous sulfate 325 (65 FE) MG tablet Take 325 mg by mouth daily with breakfast. During period    . fluocinonide-emollient (LIDEX-E) 0.05 % cream Apply 1 application topically 2 (two) times daily. Not on face 30 g 3  . Multiple Vitamins-Calcium (ONE-A-DAY WOMENS PO) Take by mouth daily.       No facility-administered  medications prior to visit.      EXAM:  BP (!) 142/80 (BP Location: Right Arm, Patient Position: Sitting, Cuff Size: Normal)   Pulse (!) 126   Temp 99.4 F (37.4 C) (Oral)   Wt 127 lb 14.4 oz (58 kg)   SpO2 (!) 69%   BMI 25.40 kg/m   Body mass index is 25.4 kg/m. WDWN in NAD  quiet respirations; mildly congested  somewhat hoarse. Non toxic . ocass coughing  Wheezy bronchial cough  HEENT: Normocephalic ;atraumatic , Eyes;  PERRL, EOMs  Full, lids and conjunctiva clear,,Ears: no deformities, canals nl, TM landmarks normal, Nose: no deformity or discharge but  Quite congested;face minimally tender Mouth : OP clear without lesion or edema . Mild redness  Neck: Supple without adenopathy or masses or bruits Chest:  Clear to A&P without wheezes rales or rhonchi CV:  S1-S2 no gallops or murmurs peripheral perfusion is normal Skin :nl perfusion and no acute rashes   Right upper arm  Large 5-6 cm redness and swollen  No fluctuance  c xray  nad some hyperinflation  ASSESSMENT AND PLAN:  Discussed the following assessment and plan:  Respiratory tract congestion with cough - Plan: DG Chest 2 View  Sinus drainage - Plan: DG Chest 2 View  Allergic rhinitis, unspecified seasonality, unspecified trigger  Sinusitis, unspecified chronicity, unspecified location DOES HAVE ALLERGIES.  Poss underlying allergic but some increasing  cw  Secondary infection -Patient advised to return or notify health care team  if symptoms worsen ,persist or new concerns arise.  Patient Instructions   Chest exam is reassuring . AND X RAY NO PNEUMONIA  Ut could be consistent with allergy asthma   Sounds like   Sinus inflammation  And drainage causing the cough . Sometimes this is from allergy but if low grade fever and getting worse treating for infection  Also .   Antibiotic and short  Course of prednisone ( can take just 3 days )  And then can begin  singular for allergy sx suppression Stay on the Flonase and  saline nose spray also .    lareg local reaction to the  shingrix is not rare and  hsould subside .   Advise  Fu in 2-3 weeks if not better  Or as needed  If singular helps then can continue for  Seasonal  Suppression of   Allergies .       Standley Brooking. Anie Juniel M.D.

## 2018-01-29 DIAGNOSIS — Z853 Personal history of malignant neoplasm of breast: Secondary | ICD-10-CM | POA: Diagnosis not present

## 2018-01-29 DIAGNOSIS — R928 Other abnormal and inconclusive findings on diagnostic imaging of breast: Secondary | ICD-10-CM | POA: Diagnosis not present

## 2018-02-09 NOTE — Progress Notes (Signed)
Chief Complaint  Patient presents with  . Annual Exam    Some sinus congestion/sore throat x 1-2 days.     HPI: Patient  Melissa Brown  52 y.o. comes in today for [place] visit  One day  Of congestion and sore throat and no fever   Is utd on flu vaccine and shingrix  Nose spray  Not back on this  Used in past . Last infection better with medication.  Vit d 2000 iu per day hx of deficiency   Has gyne check in next 6 months  Health Maintenance  Topic Date Due  . PNEUMOCOCCAL POLYSACCHARIDE VACCINE AGE 27-64 HIGH RISK  03/09/1967  . FOOT EXAM  03/09/1975  . HIV Screening  03/08/1980  . URINE MICROALBUMIN  01/15/2014  . COLONOSCOPY  03/09/2015  . TETANUS/TDAP  07/07/2016  . OPHTHALMOLOGY EXAM  07/29/2016  . HEMOGLOBIN A1C  08/07/2017  . MAMMOGRAM  01/10/2018  . PAP SMEAR  02/07/2019  . INFLUENZA VACCINE  Completed   Health Maintenance Review LIFESTYLE:  Exercise:   Try    Tobacco/ETS: no Alcohol: ocass  Sugar beverages:  ocass Sleep: 7-8  Drug use: no HH of  3 pet dog  Work:  45 - 50     ROS:  See hpi  ecema on neck using lidex  But comes back   GEN/ HEENT: No fever, significant weight changes sweats headaches vision problems hearing changes, CV/ PULM; No chest pain shortness of breath cough, syncope,edema  change in exercise tolerance. GI /GU: No adominal pain, vomiting, change in bowel habits. No blood in the stool. No significant GU symptoms. SKIN/HEME: ,no acute skin rashes  Eczema on nek uses lidex and afet a week helps but omes back suspicious lesions or bleeding. No lymphadenopathy, nodules, masses.  NEURO/ PSYCH:  No neurologic signs such as weakness numbness. No depression anxiety. IMM/ Allergy: No unusual infections.  Allergy .   REST of 12 system review negative except as per HPI   Past Medical History:  Diagnosis Date  . Allergic    to grass and moldsby skin testing   . Angioedema of lips 12/07/2010   Hx of seem in August 12  remote hx of  hives as a child.    . Breast cancer ([place]) 2005   left dcis  . Breast cancer ([place])   . Diabetes, gestational   . GESTATIONAL DIABETES 02/09/2007   Qualifier: History of  By: Regis Bill MD, Standley Brooking   . MVP (mitral valve prolapse)    ANTERIOR LEAFLET, MILD MR  . Palpitations    tachy, supressed with atenolol  . Scoliosis   . Thyroid disease    HYPERTHYROIDISM    Past Surgical History:  Procedure Laterality Date  . BREAST LUMPECTOMY     radiation DCIS- reconstruction left  . CESAREAN SECTION      Family History  Problem Relation Age of Onset  . Diabetes Mother   . Thyroid disease Mother   . Hypertension Mother   . Diabetes Father   . Heart disease Father   . Diabetes Other     Social History   Socioeconomic History  . Marital status: Married    Spouse name: Not on file  . Number of children: Not on file  . Years of education: Not on file  . Highest education level: Not on file  Occupational History  . Not on file  Social Needs  . Financial resource strain: Not on file  .  Food insecurity:    Worry: Not on file    Inability: Not on file  . Transportation needs:    Medical: Not on file    Non-medical: Not on file  Tobacco Use  . Smoking status: Never Smoker  . Smokeless tobacco: Never Used  Substance and Sexual Activity  . Alcohol use: Yes    Alcohol/week: 1.0 standard drinks    Types: 1 Standard drinks or equivalent per week  . Drug use: No  . Sexual activity: Not on file  Lifestyle  . Physical activity:    Days per week: Not on file    Minutes per session: Not on file  . Stress: Not on file  Relationships  . Social connections:    Talks on phone: Not on file    Gets together: Not on file    Attends religious service: Not on file    Active member of club or organization: Not on file    Attends meetings of clubs or organizations: Not on file    Relationship status: Not on file  Other Topics Concern  . 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 cetirizine (ZYRTEC) 10 MG tablet Allertec ([employer] Brand) 2m daily    . Cholecalciferol (VITAMIN D-3 PO) Take by mouth.    . ferrous sulfate 325 (65 FE) MG tablet Take 325 mg by mouth daily with breakfast. During period    . montelukast (SINGULAIR) 10 MG tablet Take 1 tablet (10 mg total) by mouth at bedtime. 30 tablet 3  . Multiple Vitamins-Calcium (ONE-A-DAY WOMENS PO) Take by mouth daily.      . fluocinonide-emollient (LIDEX-E) 0.05 % cream Apply 1 application topically 2 (two) times daily. Not on face 30 g 3  . doxycycline (VIBRA-TABS) 100 MG tablet Take 1 tablet (100 mg total) by mouth 2 (two) times daily. (Patient not taking: Reported on 02/10/2018) 14 tablet 0  . predniSONE (DELTASONE) 20 MG tablet Take 1 tablet (20 mg total) by mouth 2 (two) times daily with a meal. (Patient not taking: Reported on 02/10/2018) 10 tablet 0   No facility-administered medications prior to visit.      EXAM:  BP 124/78 (BP Location: Right Arm, Patient Position: Sitting, Cuff Size: Normal)   Pulse (!) 107   Temp 98.1 F (36.7 C) (Oral)   Ht 4' 11.41" (1.509 m)   Wt 127 lb 9.6 oz (57.9 kg)   BMI 25.42 kg/m   Body mass index is 25.42 kg/m. Wt Readings from Last 3 Encounters:  02/10/18 127 lb 9.6 oz (57.9 kg)  11/17/17 127 lb 14.4 oz (58 kg)  02/07/17 124 lb 9.6 oz (56.5 kg)    Physical Exam: Vital signs reviewed GTMH:DQQIis a well-developed well-nourished alert cooperative    who appearsr stated age in no acute distress.  HEENT: normocephalic atraumatic , Eyes: PERRL EOM's full, conjunctiva clear, Nares: paten,t no deformity discharge or tenderness., Ears: no deformity EAC's clear TMs with normal landmarks. Mouth: clear OP, no  lesions, edema.  Moist mucous membranes. Dentition in adequate repair. NECK: supple without masses, thyro palpable no bruits.  CHEST/PULM:  Clear to auscultation and percussion breath sounds equal no wheeze , rales or rhonchi. No chest wall deformities or tenderness. Breast: normal by inspection . No masses  Scar   Healed  CV: PMI is nondisplaced, S1 S2 no gallops, murmurs, rubs. Peripheral pulses are full without delay.No JVD .  ABDOMEN: Bowel sounds normal nontender  No guard or rebound, no hepato splenomegal no CVA tenderness.  No hernia. Extremtities:  No clubbing cyanosis or edema, no acute joint swelling or redness no focal atrophy NEURO:  Oriented x3, cranial nerves 3-12 appear to be intact, no obvious focal weakness,gait within normal limits no abnormal reflexes or asymmetrical SKIN: No acute rashes normal turgor, color, no bruising or petechiae.r neck with pink patch  No  Weeping  PSYCH: Oriented, good eye contact, no obvious depression anxiety, cognition and judgment appear normal. LN: no cervical axillary inguinal adenopathy    BP Readings from Last 3 Encounters:  02/10/18 124/78  11/17/17 (!) 142/80  02/07/17 128/86    Labplan reviewed with patient   ASSESSMENT AND PLAN:  Discussed the following assessment and plan:  Visit for preventive health examination - Plan: Basic metabolic panel, CBC with Differential/Platelet, Hemoglobin A1c, Hepatic function panel, Lipid panel, VITAMIN D 25 Hydroxy (Vit-D Deficiency, Fractures), TSH  Medication management - Plan: Basic metabolic panel, CBC with Differential/Platelet, Hemoglobin A1c, Hepatic function panel, Lipid panel, VITAMIN D 25 Hydroxy (Vit-D Deficiency, Fractures), TSH  Fasting hyperglycemia - hx gestational dm  - Plan: Basic metabolic panel, CBC with Differential/Platelet, Hemoglobin A1c, Hepatic function panel, Lipid panel, VITAMIN D 25 Hydroxy (Vit-D Deficiency, Fractures), TSH  Hyperlipidemia, unspecified hyperlipidemia  type - Plan: Basic metabolic panel, CBC with Differential/Platelet, Hemoglobin A1c, Hepatic function panel, Lipid panel, VITAMIN D 25 Hydroxy (Vit-D Deficiency, Fractures), TSH  Sinus drainage - Plan: Basic metabolic panel, CBC with Differential/Platelet, Hemoglobin A1c, Hepatic function panel, Lipid panel, VITAMIN D 25 Hydroxy (Vit-D Deficiency, Fractures)  Vitamin D deficiency - on ?2000 perday? - Plan: VITAMIN D 25 Hydroxy (Vit-D Deficiency, Fractures) Ok to refill lidex and  alternal and dow step to otc for the neck eczema.  Patient Care Team: , Standley Brooking, MD as PCP - Carrolyn Meiers, MD as Referring Physician (Obstetrics and Gynecology) Lindon Romp, MD as Consulting Physician (Internal Medicine) Patient Instructions  Ok to add flonase and saline nose spray  . Symptomatic treatments  Most respiratory infections run their course but if getting  Prolonged fever no improvement in a week  Or  Relapsing  Problems  Or persistent pain then  Contact us for reelavuation.    Get fasting lab appt .  As discussed .   Continue lifestyle intervention healthy eating and exercise .    Preventive Care 40-64 Years, Female Preventive care refers to lifestyle choices and visits with your health care provider that can promote health and wellness. What does preventive care include?  A yearly physical exam. This is also called an annual well check.  Dental exams once or twice a year.  Routine eye exams. Ask your health care provider how often you should have your eyes checked.  Personal lifestyle choices, including: ? Daily care of your teeth and gums. ? Regular physical activity. ? Eating a healthy diet. ? Avoiding tobacco and drug use. ? Limiting alcohol use. ? Practicing safe sex. ? Taking low-dose aspirin daily starting at age 15. ? Taking vitamin and mineral supplements as recommended by your health care provider. What happens during an annual well  check? The  services and screenings done by your health care provider during your annual well check will depend on your age, overall health, lifestyle risk factors, and family history of disease. Counseling Your health care provider may ask you questions about your:  Alcohol use.  Tobacco use.  Drug use.  Emotional well-being.  Home and relationship well-being.  Sexual activity.  Eating habits.  Work and work Statistician.  Method of birth control.  Menstrual cycle.  Pregnancy history.  Screening You may have the following tests or measurements:  Height, weight, and BMI.  Blood pressure.  Lipid and cholesterol levels. These may be checked every 5 years, or more frequently if you are over 63 years old.  Skin check.  Lung cancer screening. You may have this screening every year starting at age 59 if you have a 30-pack-year history of smoking and currently smoke or have quit within the past 15 years.  Fecal occult blood test (FOBT) of the stool. You may have this test every year starting at age 44.  Flexible sigmoidoscopy or colonoscopy. You may have a sigmoidoscopy every 5 years or a colonoscopy every 10 years starting at age 50.  Hepatitis C blood test.  Hepatitis B blood test.  Sexually transmitted disease (STD) testing.  Diabetes screening. This is done by checking your blood sugar (glucose) after you have not eaten for a while (fasting). You may have this done every 1-3 years.  Mammogram. This may be done every 1-2 years. Talk to your health care provider about when you should start having regular mammograms. This may depend on whether you have a family history of breast cancer.  BRCA-related cancer screening. This may be done if you have a family history of breast, ovarian, tubal, or peritoneal cancers.  Pelvic exam and Pap test. This may be done every 3 years starting at age 91. Starting at age 3, this may be done every 5 years if you have a Pap test in combination with  an HPV test.  Bone density scan. This is done to screen for osteoporosis. You may have this scan if you are at high risk for osteoporosis.  Discuss your test results, treatment options, and if necessary, the need for more tests with your health care provider. Vaccines Your health care provider may recommend certain vaccines, such as:  Influenza vaccine. This is recommended every year.  Tetanus, diphtheria, and acellular pertussis (Tdap, Td) vaccine. You may need a Td booster every 10 years.  Varicella vaccine. You may need this if you have not been vaccinated.  Zoster vaccine. You may need this after age 60.  Measles, mumps, and rubella (MMR) vaccine. You may need at least one dose of MMR if you were born in 1957 or later. You may also need a second dose.  Pneumococcal 13-valent conjugate (PCV13) vaccine. You may need this if you have certain conditions and were not previously vaccinated.  Pneumococcal polysaccharide (PPSV23) vaccine. You may need one or two doses if you smoke cigarettes or if you have certain conditions.  Meningococcal vaccine. You may need this if you have certain conditions.  Hepatitis A vaccine. You may need this if you have certain conditions or if you travel or work in places where you may be exposed to hepatitis A.  Hepatitis B vaccine. You may need this if you have certain conditions or if you travel or work in places where you may be exposed to hepatitis B.  Haemophilus influenzae type b (Hib) vaccine. You  may need this if you have certain conditions.  Talk to your health care provider about which screenings and vaccines you need and how often you need them. This information is not intended to replace advice given to you by your health care provider. Make sure you discuss any questions you have with your health care provider. Document Released: 04/21/2015 Document Revised: 12/13/2015 Document Reviewed: 01/24/2015 Elsevier Interactive Patient Education  2018  [employer].   Preventing Type 2 Diabetes Mellitus Type 2 diabetes (type 2 diabetes mellitus) is a long-term (chronic) disease that affects blood sugar (glucose) levels. Normally, a hormone called insulin allows glucose to enter cells in the body. The cells use glucose for energy. In type 2 diabetes, one or both of these problems may be present:  The body does not make enough insulin.  The body does not respond properly to insulin that it makes (insulin resistance).  Insulin resistance or lack of insulin causes excess glucose to build up in the blood instead of going into cells. As a result, high blood glucose (hyperglycemia) develops, which can cause many complications. Being overweight or obese and having an inactive (sedentary) lifestyle can increase your risk for diabetes. Type 2 diabetes can be delayed or prevented by making certain nutrition and lifestyle changes. What nutrition changes can be made?  Eat healthy meals and snacks regularly. Keep a healthy snack with you for when you get hungry between meals, such as fruit or a handful of nuts.  Eat lean meats and proteins that are low in saturated fats, such as chicken, fish, egg whites, and beans. Avoid processed meats.  Eat plenty of fruits and vegetables and plenty of grains that have not been processed (whole grains). It is recommended that you eat: ? 1?2 cups of fruit every day. ? 2?3 cups of vegetables every day. ? 6?8 oz of whole grains every day, such as oats, whole wheat, bulgur, brown rice, quinoa, and millet.  Eat low-fat dairy products, such as milk, yogurt, and cheese.  Eat foods that contain healthy fats, such as nuts, avocado, olive oil, and canola oil.  Drink water throughout the day. Avoid drinks that contain added sugar, such as soda or sweet tea.  Follow instructions from your health care provider about specific eating or drinking restrictions.  Control how much food you eat at a time (portion size). ? Check  food labels to find out the serving sizes of foods. ? Use a kitchen scale to weigh amounts of foods.  Saute or steam food instead of frying it. Cook with water or broth instead of oils or butter.  Limit your intake of: ? Salt (sodium). Have no more than 1 tsp (2,400 mg) of sodium a day. If you have heart disease or high blood pressure, have less than ? tsp (1,500 mg) of sodium a day. ? Saturated fat. This is fat that is solid at room temperature, such as butter or fat on meat. What lifestyle changes can be made?  Activity  Do moderate-intensity physical activity for at least 30 minutes on at least 5 days of the week, or as much as told by your health care provider.  Ask your health care provider what activities are safe for you. A mix of physical activities may be best, such as walking, swimming, cycling, and strength training.  Try to add physical activity into your day. For example: ? Park in spots that are farther away than usual, so that you walk more. For example, park in a  far corner of the parking lot when you go to the office or the grocery store. ? Take a walk during your lunch break. ? Use stairs instead of elevators or escalators. Weight Loss  Lose weight as directed. Your health care provider can determine how much weight loss is best for you and can help you lose weight safely.  If you are overweight or obese, you may be instructed to lose at least 5?7 % of your body weight. Alcohol and Tobacco   Limit alcohol intake to no more than 1 drink a day for nonpregnant women and 2 drinks a day for men. One drink equals 12 oz of beer, 5 oz of wine, or 1 oz of hard liquor.  Do not use any tobacco products, such as cigarettes, chewing tobacco, and e-cigarettes. If you need help quitting, ask your health care provider. Work With [place] Provider  Have your blood glucose tested regularly, as told by your health care provider.  Discuss your risk factors and how you can  reduce your risk for diabetes.  Get screening tests as told by your health care provider. You may have screening tests regularly, especially if you have certain risk factors for type 2 diabetes.  Make an appointment with a diet and nutrition specialist (registered dietitian). A registered dietitian can help you make a healthy eating plan and can help you understand portion sizes and food labels. Why are these changes important?  It is possible to prevent or delay type 2 diabetes and related health problems by making lifestyle and nutrition changes.  It can be difficult to recognize signs of type 2 diabetes. The best way to avoid possible damage to your body is to take actions to prevent the disease before you develop symptoms. What can happen if changes are not made?  Your blood glucose levels may keep increasing. Having high blood glucose for a long time is dangerous. Too much glucose in your blood can damage your blood vessels, heart, kidneys, nerves, and eyes.  You may develop prediabetes or type 2 diabetes. Type 2 diabetes can lead to many chronic health problems and complications, such as: ? Heart disease. ? Stroke. ? Blindness. ? Kidney disease. ? Depression. ? Poor circulation in the feet and legs, which could lead to surgical removal (amputation) in severe cases. Where to find support:  Ask your health care provider to recommend a registered dietitian, diabetes educator, or weight loss program.  Look for local or online weight loss groups.  Join a gym, fitness club, or outdoor activity group, such as a walking club. Where to find more information: To learn more about diabetes and diabetes prevention, visit:  American Diabetes Association (ADA): www.diabetes.[employer] of Diabetes and Digestive and Kidney Diseases: FindSpin.nl  To learn more about healthy eating, visit:  The U.S. Department of Agriculture Scientist, research (physical sciences)), Choose My  Plate: http://wiley-williams.com/  Office of Disease Prevention and Health Promotion (ODPHP), Dietary Guidelines: SurferLive.at  Summary  You can reduce your risk for type 2 diabetes by increasing your physical activity, eating healthy foods, and losing weight as directed.  Talk with your health care provider about your risk for type 2 diabetes. Ask about any blood tests or screening tests that you need to have. This information is not intended to replace advice given to you by your health care provider. Make sure you discuss any questions you have with your health care provider. Document Released: 07/17/2015 Document Revised: 08/31/2015 Document Reviewed: 05/16/2015 Elsevier Interactive Patient  Education  2018 [place]  M.D.

## 2018-02-10 ENCOUNTER — Encounter: Payer: Self-pay | Admitting: Internal Medicine

## 2018-02-10 ENCOUNTER — Ambulatory Visit (INDEPENDENT_AMBULATORY_CARE_PROVIDER_SITE_OTHER): Payer: BLUE CROSS/BLUE SHIELD | Admitting: Internal Medicine

## 2018-02-10 VITALS — BP 124/78 | HR 107 | Temp 98.1°F | Ht 59.41 in | Wt 127.6 lb

## 2018-02-10 DIAGNOSIS — R7301 Impaired fasting glucose: Secondary | ICD-10-CM | POA: Diagnosis not present

## 2018-02-10 DIAGNOSIS — E785 Hyperlipidemia, unspecified: Secondary | ICD-10-CM

## 2018-02-10 DIAGNOSIS — E559 Vitamin D deficiency, unspecified: Secondary | ICD-10-CM

## 2018-02-10 DIAGNOSIS — Z79899 Other long term (current) drug therapy: Secondary | ICD-10-CM

## 2018-02-10 DIAGNOSIS — Z0000 Encounter for general adult medical examination without abnormal findings: Secondary | ICD-10-CM

## 2018-02-10 DIAGNOSIS — J3489 Other specified disorders of nose and nasal sinuses: Secondary | ICD-10-CM | POA: Diagnosis not present

## 2018-02-10 MED ORDER — FLUOCINONIDE-E 0.05 % EX CREA: 1.0000 "application " | TOPICAL_CREAM | Freq: Two times a day (BID) | CUTANEOUS | 3 refills | Status: DC

## 2018-02-10 NOTE — Patient Instructions (Signed)
Ok to add flonase and saline nose spray  . Symptomatic treatments  Most respiratory infections run their course but if getting  Prolonged fever no improvement in a week  Or  Relapsing  Problems  Or persistent pain then  Contact us for reelavuation.    Get fasting lab appt .  As discussed .   Continue lifestyle intervention healthy eating and exercise .    Preventive Care 40-64 Years, Female Preventive care refers to lifestyle choices and visits with your health care provider that can promote health and wellness. What does preventive care include?  A yearly physical exam. This is also called an annual well check.  Dental exams once or twice a year.  Routine eye exams. Ask your health care provider how often you should have your eyes checked.  Personal lifestyle choices, including: ? Daily care of your teeth and gums. ? Regular physical activity. ? Eating a healthy diet. ? Avoiding tobacco and drug use. ? Limiting alcohol use. ? Practicing safe sex. ? Taking low-dose aspirin daily starting at age 57. ? Taking vitamin and mineral supplements as recommended by your health care provider. What happens during an annual well check? The services and screenings done by your health care provider during your annual well check will depend on your age, overall health, lifestyle risk factors, and family history of disease. Counseling Your health care provider may ask you questions about your:  Alcohol use.  Tobacco use.  Drug use.  Emotional well-being.  Home and relationship well-being.  Sexual activity.  Eating habits.  Work and work Statistician.  Method of birth control.  Menstrual cycle.  Pregnancy history.  Screening You may have the following tests or measurements:  Height, weight, and BMI.  Blood pressure.  Lipid and cholesterol levels. These may be checked every 5 years, or more frequently if you are over 84 years old.  Skin check.  Lung cancer screening.  You may have this screening every year starting at age 32 if you have a 30-pack-year history of smoking and currently smoke or have quit within the past 15 years.  Fecal occult blood test (FOBT) of the stool. You may have this test every year starting at age 44.  Flexible sigmoidoscopy or colonoscopy. You may have a sigmoidoscopy every 5 years or a colonoscopy every 10 years starting at age 62.  Hepatitis C blood test.  Hepatitis B blood test.  Sexually transmitted disease (STD) testing.  Diabetes screening. This is done by checking your blood sugar (glucose) after you have not eaten for a while (fasting). You may have this done every 1-3 years.  Mammogram. This may be done every 1-2 years. Talk to your health care provider about when you should start having regular mammograms. This may depend on whether you have a family history of breast cancer.  BRCA-related cancer screening. This may be done if you have a family history of breast, ovarian, tubal, or peritoneal cancers.  Pelvic exam and Pap test. This may be done every 3 years starting at age 26. Starting at age 65, this may be done every 5 years if you have a Pap test in combination with an HPV test.  Bone density scan. This is done to screen for osteoporosis. You may have this scan if you are at high risk for osteoporosis.  Discuss your test results, treatment options, and if necessary, the need for more tests with your health care provider. Vaccines Your health care provider may recommend certain vaccines, such  as:  Influenza vaccine. This is recommended every year.  Tetanus, diphtheria, and acellular pertussis (Tdap, Td) vaccine. You may need a Td booster every 10 years.  Varicella vaccine. You may need this if you have not been vaccinated.  Zoster vaccine. You may need this after age 13.  Measles, mumps, and rubella (MMR) vaccine. You may need at least one dose of MMR if you were born in 1957 or later. You may also need a  second dose.  Pneumococcal 13-valent conjugate (PCV13) vaccine. You may need this if you have certain conditions and were not previously vaccinated.  Pneumococcal polysaccharide (PPSV23) vaccine. You may need one or two doses if you smoke cigarettes or if you have certain conditions.  Meningococcal vaccine. You may need this if you have certain conditions.  Hepatitis A vaccine. You may need this if you have certain conditions or if you travel or work in places where you may be exposed to hepatitis A.  Hepatitis B vaccine. You may need this if you have certain conditions or if you travel or work in places where you may be exposed to hepatitis B.  Haemophilus influenzae type b (Hib) vaccine. You may need this if you have certain conditions.  Talk to your health care provider about which screenings and vaccines you need and how often you need them. This information is not intended to replace advice given to you by your health care provider. Make sure you discuss any questions you have with your health care provider. Document Released: 04/21/2015 Document Revised: 12/13/2015 Document Reviewed: 01/24/2015 Elsevier Interactive Patient Education  2018 [employer].   Preventing Type 2 Diabetes Mellitus Type 2 diabetes (type 2 diabetes mellitus) is a long-term (chronic) disease that affects blood sugar (glucose) levels. Normally, a hormone called insulin allows glucose to enter cells in the body. The cells use glucose for energy. In type 2 diabetes, one or both of these problems may be present:  The body does not make enough insulin.  The body does not respond properly to insulin that it makes (insulin resistance).  Insulin resistance or lack of insulin causes excess glucose to build up in the blood instead of going into cells. As a result, high blood glucose (hyperglycemia) develops, which can cause many complications. Being overweight or obese and having an inactive (sedentary) lifestyle can  increase your risk for diabetes. Type 2 diabetes can be delayed or prevented by making certain nutrition and lifestyle changes. What nutrition changes can be made?  Eat healthy meals and snacks regularly. Keep a healthy snack with you for when you get hungry between meals, such as fruit or a handful of nuts.  Eat lean meats and proteins that are low in saturated fats, such as chicken, fish, egg whites, and beans. Avoid processed meats.  Eat plenty of fruits and vegetables and plenty of grains that have not been processed (whole grains). It is recommended that you eat: ? 1?2 cups of fruit every day. ? 2?3 cups of vegetables every day. ? 6?8 oz of whole grains every day, such as oats, whole wheat, bulgur, brown rice, quinoa, and millet.  Eat low-fat dairy products, such as milk, yogurt, and cheese.  Eat foods that contain healthy fats, such as nuts, avocado, olive oil, and canola oil.  Drink water throughout the day. Avoid drinks that contain added sugar, such as soda or sweet tea.  Follow instructions from your health care provider about specific eating or drinking restrictions.  Control how much food you  eat at a time (portion size). ? Check food labels to find out the serving sizes of foods. ? Use a kitchen scale to weigh amounts of foods.  Saute or steam food instead of frying it. Cook with water or broth instead of oils or butter.  Limit your intake of: ? Salt (sodium). Have no more than 1 tsp (2,400 mg) of sodium a day. If you have heart disease or high blood pressure, have less than ? tsp (1,500 mg) of sodium a day. ? Saturated fat. This is fat that is solid at room temperature, such as butter or fat on meat. What lifestyle changes can be made?  Activity  Do moderate-intensity physical activity for at least 30 minutes on at least 5 days of the week, or as much as told by your health care provider.  Ask your health care provider what activities are safe for you. A mix of  physical activities may be best, such as walking, swimming, cycling, and strength training.  Try to add physical activity into your day. For example: ? Park in spots that are farther away than usual, so that you walk more. For example, park in a far corner of the parking lot when you go to the office or the grocery store. ? Take a walk during your lunch break. ? Use stairs instead of elevators or escalators. Weight Loss  Lose weight as directed. Your health care provider can determine how much weight loss is best for you and can help you lose weight safely.  If you are overweight or obese, you may be instructed to lose at least 5?7 % of your body weight. Alcohol and Tobacco   Limit alcohol intake to no more than 1 drink a day for nonpregnant women and 2 drinks a day for men. One drink equals 12 oz of beer, 5 oz of wine, or 1 oz of hard liquor.  Do not use any tobacco products, such as cigarettes, chewing tobacco, and e-cigarettes. If you need help quitting, ask your health care provider. Work With Rote Provider  Have your blood glucose tested regularly, as told by your health care provider.  Discuss your risk factors and how you can reduce your risk for diabetes.  Get screening tests as told by your health care provider. You may have screening tests regularly, especially if you have certain risk factors for type 2 diabetes.  Make an appointment with a diet and nutrition specialist (registered dietitian). A registered dietitian can help you make a healthy eating plan and can help you understand portion sizes and food labels. Why are these changes important?  It is possible to prevent or delay type 2 diabetes and related health problems by making lifestyle and nutrition changes.  It can be difficult to recognize signs of type 2 diabetes. The best way to avoid possible damage to your body is to take actions to prevent the disease before you develop symptoms. What can happen  if changes are not made?  Your blood glucose levels may keep increasing. Having high blood glucose for a long time is dangerous. Too much glucose in your blood can damage your blood vessels, heart, kidneys, nerves, and eyes.  You may develop prediabetes or type 2 diabetes. Type 2 diabetes can lead to many chronic health problems and complications, such as: ? Heart disease. ? Stroke. ? Blindness. ? Kidney disease. ? Depression. ? Poor circulation in the feet and legs, which could lead to surgical removal (amputation) in severe  cases. Where to find support:  Ask your health care provider to recommend a registered dietitian, diabetes educator, or weight loss program.  Look for local or online weight loss groups.  Join a gym, fitness club, or outdoor activity group, such as a walking club. Where to find more information: To learn more about diabetes and diabetes prevention, visit:  American Diabetes Association (ADA): www.diabetes.[employer] of Diabetes and Digestive and Kidney Diseases: FindSpin.nl  To learn more about healthy eating, visit:  The U.S. Department of Agriculture Scientist, research (physical sciences)), Choose My Plate: http://wiley-williams.com/  Office of Disease Prevention and Health Promotion (ODPHP), Dietary Guidelines: SurferLive.at  Summary  You can reduce your risk for type 2 diabetes by increasing your physical activity, eating healthy foods, and losing weight as directed.  Talk with your health care provider about your risk for type 2 diabetes. Ask about any blood tests or screening tests that you need to have. This information is not intended to replace advice given to you by your health care provider. Make sure you discuss any questions you have with your health care provider. Document Released: 07/17/2015 Document Revised: 08/31/2015 Document Reviewed: 05/16/2015 Elsevier Interactive Patient Education  [employer].

## 2018-03-13 ENCOUNTER — Encounter: Payer: Self-pay | Admitting: Internal Medicine

## 2018-03-13 ENCOUNTER — Ambulatory Visit: Payer: BLUE CROSS/BLUE SHIELD | Admitting: Internal Medicine

## 2018-03-13 VITALS — BP 128/84 | HR 105 | Temp 98.0°F | Wt 131.1 lb

## 2018-03-13 DIAGNOSIS — L02821 Furuncle of head [any part, except face]: Secondary | ICD-10-CM

## 2018-03-13 MED ORDER — DOXYCYCLINE HYCLATE 100 MG PO TABS: 100.0000 mg | ORAL_TABLET | Freq: Two times a day (BID) | ORAL | 0 refills | Status: DC

## 2018-03-13 NOTE — Progress Notes (Signed)
Chief Complaint  Patient presents with  . Lump on Neck    Lump on back of neck x 4 days. Painful, hard and warm to touch. bump is in hair line. looks yellow as if filled with puss.     HPI: Melissa Brown 52 y.o. come in for new problem  About a week felt bump and last 3 days painful and this am without fever   Other sx . Uses topical  steroids on the neck  Rash prob   Eczema    No other sx of this  Using topical antibiotic  ROS: See pertinent positives and negatives per HPI.  Past Medical History:  Diagnosis Date  . Allergic    to grass and moldsby skin testing   . Angioedema of lips 12/07/2010   Hx of seem in August 12  remote hx of hives as a child.    . Breast cancer ([place]) 2005   left dcis  . Breast cancer ([place])   . Diabetes, gestational   . GESTATIONAL DIABETES 02/09/2007   Qualifier: History of  By: Regis Bill MD, Standley Brooking   . MVP (mitral valve prolapse)    ANTERIOR LEAFLET, MILD MR  . Palpitations    tachy, supressed with atenolol  . Scoliosis   . Thyroid disease    HYPERTHYROIDISM    Family History  Problem Relation Age of Onset  . Diabetes Mother   . Thyroid disease Mother   . Hypertension Mother   . Diabetes Father   . Heart disease Father   . Diabetes Other     Social History   Socioeconomic History  . Marital status: Married    Spouse name: Not on file  . Number of children: Not on file  . Years of education: Not on file  . Highest education level: Not on file  Occupational History  . Not on file  Social Needs  . Financial resource strain: Not on file  . Food insecurity:    Worry: Not on file    Inability: Not on file  . Transportation needs:    Medical: Not on file    Non-medical: Not on file  Tobacco Use  . Smoking status: Never Smoker  . Smokeless tobacco: Never Used  Substance and Sexual Activity  . Alcohol use: Yes    Alcohol/week: 1.0 standard drinks    Types: 1 Standard drinks or equivalent per week  . Drug use: No  . Sexual activity:  Not on file  Lifestyle  . Physical activity:    Days per week: Not on file    Minutes per session: Not on file  . Stress: Not on file  Relationships  . Social connections:    Talks on phone: Not on file    Gets together: Not on file    Attends religious service: Not on file    Active member of club or organization: Not on file    Attends meetings of clubs or organizations: Not on file    Relationship status: Not on file  Other Topics Concern  . 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 cetirizine (ZYRTEC) 10 MG tablet Allertec ([employer] Brand) 10mg  daily    . Cholecalciferol (VITAMIN D-3 PO) Take by mouth.    . ferrous sulfate 325 (65 FE) MG tablet Take 325 mg by mouth daily with breakfast. During period    . fluocinonide-emollient (LIDEX-E) 0.05 % cream Apply 1 application topically 2 (two) times daily. Not on face 30 g 3  . montelukast (SINGULAIR) 10 MG tablet Take 1 tablet (10 mg total) by mouth at bedtime. 30 tablet 3  . Multiple Vitamins-Calcium (ONE-A-DAY WOMENS PO) Take by mouth daily.       No facility-administered medications prior to visit.      EXAM:  BP 128/84 (BP Location: Right Arm, Patient Position: Sitting, Cuff Size: Normal)   Pulse (!) 105   Temp 98 F (36.7 C) (Oral)   Wt 131 lb 1.6 oz (59.5 kg)   BMI 26.12 kg/m   Body mass index is 26.12 kg/m.  GENERAL: vitals reviewed and listed above, alert, oriented, appears well hydrated and in no acute distress HEENT: atraumatic, conjunctiva  clear, no obvious abnormalities on inspection of external nose and ears  Right post scalp with a   Lump with cnetral pustule and crusting  Mild to mod tendner  And no  Streaking   About 1 .25 cm   No  sig adenopathjy   Neck skin  Faded eczema   OP : no lesion edema or exudate  NECK: no obvious masses on inspection palpation  See above  PSYCH: pleasant and cooperative, no obvious depression or anxiety  BP Readings from Last 3 Encounters:  03/13/18 128/84  02/10/18 124/78  11/17/17 (!) 142/80    ASSESSMENT AND PLAN:  Discussed the following assessment and plan:  Boil of scalp Scalp  Infection  No thing to drain but   Like a pustule    ? Already drainage .  ;local care add antibiotic   And   Expectant management. And fu over weekend   Should be a lot better after weekend   And fu  If needed.   -Patient advised to return or notify health care team  if  new concerns arise.  Patient Instructions  Melissa Brown is   Like a boil on your scalp area   Infection usually bacterial   Warm compresses   At least 3x for 5-10 minutes  per day and increase  As needed   Add antibiotic    As planned  Expect improvement in the next 48- 72 hours.  If   persistent or progressive  Fever etc. contact us .    Standley Brooking. Rye Decoste M.D.

## 2018-03-13 NOTE — Patient Instructions (Addendum)
Thi is   Like a boil on your scalp area   Infection usually bacterial   Warm compresses   At least 3x for 5-10 minutes  per day and increase  As needed   Add antibiotic    As planned  Expect improvement in the next 48- 72 hours.  If   persistent or progressive  Fever etc. contact us .

## 2018-03-25 ENCOUNTER — Other Ambulatory Visit (INDEPENDENT_AMBULATORY_CARE_PROVIDER_SITE_OTHER): Payer: BLUE CROSS/BLUE SHIELD

## 2018-03-25 DIAGNOSIS — Z79899 Other long term (current) drug therapy: Secondary | ICD-10-CM

## 2018-03-25 DIAGNOSIS — J3489 Other specified disorders of nose and nasal sinuses: Secondary | ICD-10-CM

## 2018-03-25 DIAGNOSIS — E785 Hyperlipidemia, unspecified: Secondary | ICD-10-CM

## 2018-03-25 DIAGNOSIS — R7301 Impaired fasting glucose: Secondary | ICD-10-CM | POA: Diagnosis not present

## 2018-03-25 DIAGNOSIS — Z0000 Encounter for general adult medical examination without abnormal findings: Secondary | ICD-10-CM | POA: Diagnosis not present

## 2018-03-25 DIAGNOSIS — E559 Vitamin D deficiency, unspecified: Secondary | ICD-10-CM

## 2018-03-25 LAB — BASIC METABOLIC PANEL
BUN: 14 mg/dL (ref 6–23)
CALCIUM: 9.1 mg/dL (ref 8.4–10.5)
CO2: 27 mEq/L (ref 19–32)
CREATININE: 0.69 mg/dL (ref 0.40–1.20)
Chloride: 102 mEq/L (ref 96–112)
GFR: 94.58 mL/min (ref >60.00)
GLUCOSE: 112 mg/dL — AB (ref 70–99)
Potassium: 4.4 mEq/L (ref 3.5–5.1)
Sodium: 137 mEq/L (ref 135–145)

## 2018-03-25 LAB — HEPATIC FUNCTION PANEL
ALT: 20 U/L (ref 0–35)
AST: 17 U/L (ref 0–37)
Albumin: 4.4 g/dL (ref 3.5–5.2)
Alkaline Phosphatase: 83 U/L (ref 39–117)
BILIRUBIN DIRECT: 0.1 mg/dL (ref 0.0–0.3)
BILIRUBIN TOTAL: 0.5 mg/dL (ref 0.2–1.2)
Total Protein: 7.7 g/dL (ref 6.0–8.3)

## 2018-03-25 LAB — CBC WITH DIFFERENTIAL/PLATELET
BASOS PCT: 0.7 % (ref 0.0–3.0)
Basophils Absolute: 0 10*3/uL (ref 0.0–0.1)
EOS PCT: 7.3 % — AB (ref 0.0–5.0)
Eosinophils Absolute: 0.3 10*3/uL (ref 0.0–0.7)
HCT: 40 % (ref 36.0–46.0)
HEMOGLOBIN: 13.7 g/dL (ref 12.0–15.0)
Lymphocytes Relative: 26.3 % (ref 12.0–46.0)
Lymphs Abs: 1.2 10*3/uL (ref 0.7–4.0)
MCHC: 34.2 g/dL (ref 30.0–36.0)
MCV: 91.7 fl (ref 78.0–100.0)
MONOS PCT: 7 % (ref 3.0–12.0)
Monocytes Absolute: 0.3 10*3/uL (ref 0.1–1.0)
Neutro Abs: 2.7 10*3/uL (ref 1.4–7.7)
Neutrophils Relative %: 58.7 % (ref 43.0–77.0)
Platelets: 299 10*3/uL (ref 150.0–400.0)
RBC: 4.37 Mil/uL (ref 3.87–5.11)
RDW: 13.5 % (ref 11.5–15.5)
WBC: 4.6 10*3/uL (ref 4.0–10.5)

## 2018-03-25 LAB — LIPID PANEL
Cholesterol: 222 mg/dL — ABNORMAL HIGH (ref 0–200)
HDL: 48.9 mg/dL (ref >39.00)
LDL CALC: 142 mg/dL — AB (ref 0–99)
NONHDL: 173.05
Total CHOL/HDL Ratio: 5
Triglycerides: 157 mg/dL — ABNORMAL HIGH (ref 0.0–149.0)
VLDL: 31.4 mg/dL (ref 0.0–40.0)

## 2018-03-25 LAB — HEMOGLOBIN A1C: HEMOGLOBIN A1C: 6 % (ref 4.6–6.5)

## 2018-03-25 LAB — TSH: TSH: 0.95 u[IU]/mL (ref 0.35–4.50)

## 2018-03-25 LAB — VITAMIN D 25 HYDROXY (VIT D DEFICIENCY, FRACTURES): VITD: 34.2 ng/mL (ref 30.00–100.00)

## 2018-04-21 ENCOUNTER — Encounter: Payer: Self-pay | Admitting: Internal Medicine

## 2018-04-21 ENCOUNTER — Ambulatory Visit: Payer: BLUE CROSS/BLUE SHIELD | Admitting: Internal Medicine

## 2018-04-21 VITALS — BP 122/74 | HR 95 | Temp 98.2°F | Ht 59.0 in | Wt 127.4 lb

## 2018-04-21 DIAGNOSIS — R7301 Impaired fasting glucose: Secondary | ICD-10-CM

## 2018-04-21 DIAGNOSIS — M25511 Pain in right shoulder: Secondary | ICD-10-CM | POA: Diagnosis not present

## 2018-04-21 DIAGNOSIS — R3989 Other symptoms and signs involving the genitourinary system: Secondary | ICD-10-CM | POA: Diagnosis not present

## 2018-04-21 LAB — POCT URINALYSIS DIPSTICK
Bilirubin, UA: NEGATIVE
Blood, UA: NEGATIVE
Glucose, UA: POSITIVE — AB
Ketones, UA: NEGATIVE
NITRITE UA: NEGATIVE
Odor: NEGATIVE
Protein, UA: NEGATIVE
Spec Grav, UA: 1.01 (ref 1.010–1.025)
Urobilinogen, UA: 0.2 E.U./dL
pH, UA: 6.5 (ref 5.0–8.0)

## 2018-04-21 MED ORDER — NITROFURANTOIN MONOHYD MACRO 100 MG PO CAPS: 100.0000 mg | ORAL_CAPSULE | Freq: Two times a day (BID) | ORAL | 0 refills | Status: AC

## 2018-04-21 MED ORDER — MELOXICAM 15 MG PO TABS: 15.0000 mg | ORAL_TABLET | Freq: Every day | ORAL | 0 refills | Status: DC

## 2018-04-21 NOTE — Patient Instructions (Addendum)
Antibiotic for presumed UTI  Will send for culture and let you know results.   Try antiinflammatory for a 7-10 days  And if not improving consider  Get opnion from  sprts medicine  Think this is a mechanical cuase and not cardiovascular cause.   Attention to  lifestyle intervention healthy eating and exercise . And ROV in 6 months to check your hg a1c in office .  Consider adding metformin at any time.      Urinary Tract Infection, Adult  A urinary tract infection (UTI) is an infection of any part of the urinary tract. The urinary tract includes the kidneys, ureters, bladder, and urethra. These organs make, store, and get rid of urine in the body. Your health care provider may use other names to describe the infection. An upper UTI affects the ureters and kidneys (pyelonephritis). A lower UTI affects the bladder (cystitis) and urethra (urethritis). What are the causes? Most urinary tract infections are caused by bacteria in your genital area, around the entrance to your urinary tract (urethra). These bacteria grow and cause inflammation of your urinary tract. What increases the risk? You are more likely to develop this condition if:  You have a urinary catheter that stays in place (indwelling).  You are not able to control when you urinate or have a bowel movement (you have incontinence).  You are female and you: ? Use a spermicide or diaphragm for birth control. ? Have low estrogen levels. ? Are pregnant.  You have certain genes that increase your risk (genetics).  You are sexually active.  You take antibiotic medicines.  You have a condition that causes your flow of urine to slow down, such as: ? An enlarged prostate, if you are female. ? Blockage in your urethra (stricture). ? A kidney stone. ? A nerve condition that affects your bladder control (neurogenic bladder). ? Not getting enough to drink, or not urinating often.  You have certain medical conditions, such  as: ? Diabetes. ? A weak disease-fighting system (immunesystem). ? Sickle cell disease. ? Gout. ? Spinal cord injury. What are the signs or symptoms? Symptoms of this condition include:  Needing to urinate right away (urgently).  Frequent urination or passing small amounts of urine frequently.  Pain or burning with urination.  Blood in the urine.  Urine that smells bad or unusual.  Trouble urinating.  Cloudy urine.  Vaginal discharge, if you are female.  Pain in the abdomen or the lower back. You may also have:  Vomiting or a decreased appetite.  Confusion.  Irritability or tiredness.  A fever.  Diarrhea. The first symptom in older adults may be confusion. In some cases, they may not have any symptoms until the infection has worsened. How is this diagnosed? This condition is diagnosed based on your medical history and a physical exam. You may also have other tests, including:  Urine tests.  Blood tests.  Tests for sexually transmitted infections (STIs). If you have had more than one UTI, a cystoscopy or imaging studies may be done to determine the cause of the infections. How is this treated? Treatment for this condition includes:  Antibiotic medicine.  Over-the-counter medicines to treat discomfort.  Drinking enough water to stay hydrated. If you have frequent infections or have other conditions such as a kidney stone, you may need to see a health care provider who specializes in the urinary tract (urologist). In rare cases, urinary tract infections can cause sepsis. Sepsis is a life-threatening condition that occurs  when the body responds to an infection. Sepsis is treated in the hospital with IV antibiotics, fluids, and other medicines. Follow these instructions at home:  Medicines  Take over-the-counter and prescription medicines only as told by your health care provider.  If you were prescribed an antibiotic medicine, take it as told by your health  care provider. Do not stop using the antibiotic even if you start to feel better. General instructions  Make sure you: ? Empty your bladder often and completely. Do not hold urine for long periods of time. ? Empty your bladder after sex. ? Wipe from front to back after a bowel movement if you are female. Use each tissue one time when you wipe.  Drink enough fluid to keep your urine pale yellow.  Keep all follow-up visits as told by your health care provider. This is important. Contact a health care provider if:  Your symptoms do not get better after 1-2 days.  Your symptoms go away and then return. Get help right away if you have:  Severe pain in your back or your lower abdomen.  A fever.  Nausea or vomiting. Summary  A urinary tract infection (UTI) is an infection of any part of the urinary tract, which includes the kidneys, ureters, bladder, and urethra.  Most urinary tract infections are caused by bacteria in your genital area, around the entrance to your urinary tract (urethra).  Treatment for this condition often includes antibiotic medicines.  If you were prescribed an antibiotic medicine, take it as told by your health care provider. Do not stop using the antibiotic even if you start to feel better.  Keep all follow-up visits as told by your health care provider. This is important. This information is not intended to replace advice given to you by your health care provider. Make sure you discuss any questions you have with your health care provider. Document Released: 01/02/2005 Document Revised: 10/02/2017 Document Reviewed: 10/02/2017 Elsevier Interactive Patient Education  2019 [place].  Shoulder Pain Many things can cause shoulder pain, including:  An injury to the shoulder.  Overuse of the shoulder.  Arthritis. The source of the pain can be:  Inflammation.  An injury to the shoulder joint.  An injury to a tendon, ligament, or bone. Follow these  instructions at home: Pay attention to changes in your symptoms. Let your health care provider know about them. Follow these instructions to relieve your pain. If you have a sling:  Wear the sling as told by your health care provider. Remove it only as told by your health care provider.  Loosen the sling if your fingers tingle, become numb, or turn cold and blue.  Keep the sling clean.  If the sling is not waterproof: ? Do not let it get wet. Remove it to shower or bathe.  Move your arm as little as possible, but keep your hand moving to prevent swelling. Managing pain, stiffness, and swelling   If directed, put ice on the painful area: ? Put ice in a plastic bag. ? Place a towel between your skin and the bag. ? Leave the ice on for 20 minutes, 2-3 times per day. Stop applying ice if it does not help with the pain.  Squeeze a soft ball or a foam pad as much as possible. This helps to keep the shoulder from swelling. It also helps to strengthen the arm. General instructions  Take over-the-counter and prescription medicines only as told by your health care provider.  Keep  all follow-up visits as told by your health care provider. This is important. Contact a health care provider if:  Your pain gets worse.  Your pain is not relieved with medicines.  New pain develops in your arm, hand, or fingers. Get help right away if:  Your arm, hand, or fingers: ? Tingle. ? Become numb. ? Become swollen. ? Become painful. ? Turn white or blue. Summary  Shoulder pain can be caused by an injury, overuse, or arthritis.  Pay attention to changes in your symptoms. Let your health care provider know about them.  This condition may be treated with a sling, ice, and pain medicines.  Contact your health care provider if the pain gets worse or new pain develops. Get help right away if your arm, hand, or fingers tingle or become numb, swollen, or painful.  Keep all follow-up visits as told  by your health care provider. This is important. This information is not intended to replace advice given to you by your health care provider. Make sure you discuss any questions you have with your health care provider. Document Released: 01/02/2005 Document Revised: 10/07/2017 Document Reviewed: 10/07/2017 Elsevier Interactive Patient Education  2019 [employer].

## 2018-04-21 NOTE — Progress Notes (Signed)
Chief Complaint  Patient presents with  . Shoulder Pain    x 3 weeks right shoulder pt states no injury   . Urinary Tract Infection    since sunday pt states there is burning and urgency     HPI: Melissa Brown 53 y.o. come in for sda    uti s hematuria    But also has another problem  Onset  sund after noon  And then traces in   Urine and pressure  And lbp   No fever .   Shoulder  See above awaoke w   Pain and still presenting. Tried ibuprofen and some better short time.  3 times  ROS: See pertinent positives and negatives per HPI.  Past Medical History:  Diagnosis Date  . Allergic    to grass and moldsby skin testing   . Angioedema of lips 12/07/2010   Hx of seem in August 12  remote hx of hives as a child.    . Breast cancer ([place]) 2005   left dcis  . Breast cancer ([place])   . Diabetes, gestational   . GESTATIONAL DIABETES 02/09/2007   Qualifier: History of  By: Regis Bill MD, Standley Brooking   . MVP (mitral valve prolapse)    ANTERIOR LEAFLET, MILD MR  . Palpitations    tachy, supressed with atenolol  . Scoliosis   . Thyroid disease    HYPERTHYROIDISM    Family History  Problem Relation Age of Onset  . Diabetes Mother   . Thyroid disease Mother   . Hypertension Mother   . Diabetes Father   . Heart disease Father   . Diabetes Other     Social History   Socioeconomic History  . Marital status: Married    Spouse name: Not on file  . Number of children: Not on file  . Years of education: Not on file  . Highest education level: Not on file  Occupational History  . Not on file  Social Needs  . Financial resource strain: Not on file  . Food insecurity:    Worry: Not on file    Inability: Not on file  . Transportation needs:    Medical: Not on file    Non-medical: Not on file  Tobacco Use  . Smoking status: Never Smoker  . Smokeless tobacco: Never Used  Substance and Sexual Activity  . Alcohol use: Yes    Alcohol/week: 1.0 standard drinks    Types: 1 Standard drinks  or equivalent per week  . Drug use: No  . Sexual activity: Not on file  Lifestyle  . Physical activity:    Days per week: Not on file    Minutes per session: Not on file  . Stress: Not on file  Relationships  . Social connections:    Talks on phone: Not on file    Gets together: Not on file    Attends religious service: Not on file    Active member of club or organization: Not on file    Attends meetings of clubs or organizations: Not on file    Relationship status: Not on file  Other Topics Concern  . 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 cetirizine (ZYRTEC) 10 MG tablet Allertec ([employer] Brand) 10mg  daily    . Cholecalciferol (VITAMIN D-3 PO) Take by mouth.    . ferrous sulfate 325 (65 FE) MG tablet Take 325 mg by mouth daily with breakfast. During period    . fluocinonide-emollient (LIDEX-E) 0.05 % cream Apply 1 application topically 2 (two) times daily. Not on face 30 g 3  . montelukast (SINGULAIR) 10 MG tablet Take 1 tablet (10 mg total) by mouth at bedtime. 30 tablet 3  . Multiple Vitamins-Calcium (ONE-A-DAY WOMENS PO) Take by mouth daily.      Marland Kitchen doxycycline (VIBRA-TABS) 100 MG tablet Take 1 tablet (100 mg total) by mouth 2 (two) times daily. (Patient not taking: Reported on 04/21/2018) 14 tablet 0   No facility-administered medications prior to visit.      EXAM:  BP 122/74 (BP Location: Right Arm, Patient Position: Sitting, Cuff Size: Normal)   Pulse 95   Temp 98.2 F (36.8 C) (Oral)   Ht 4\' 11"  (1.499 m)   Wt 127 lb 6.4 oz (57.8 kg)   LMP 03/30/2018   BMI 25.73 kg/m   Body mass index is 25.73 kg/m.  GENERAL: vitals reviewed and listed above, alert, oriented, appears well hydrated  and in no acute distress HEENT: atraumatic, conjunctiva  clear, no obvious abnormalities on inspection of external nose and ears OP : no lesion edema or exudate  NECK: no obvious masses on inspection palpation  LUNGS: clear to auscultation bilaterally, no wheezes, rales or rhonchi, good air movement CV: HRRR, no clubbing cyanosis or  peripheral edema nl cap refill  MS: moves all extremities without noticeable focal  abnormality PSYCH: pleasant and cooperative, no obvious depression or anxiety Lab Results  Component Value Date   WBC 4.6 03/25/2018   HGB 13.7 03/25/2018   HCT 40.0 03/25/2018   PLT 299.0 03/25/2018   GLUCOSE 112 (H) 03/25/2018   CHOL 222 (H) 03/25/2018   TRIG 157.0 (H) 03/25/2018   HDL 48.90 03/25/2018   LDLDIRECT 166.0 01/29/2016   LDLCALC 142 (H) 03/25/2018   ALT 20 03/25/2018   AST 17 03/25/2018   NA 137 03/25/2018   K 4.4 03/25/2018   CL 102 03/25/2018   CREATININE 0.69 03/25/2018   BUN 14 03/25/2018   CO2 27 03/25/2018   TSH 0.95 03/25/2018   HGBA1C 6.0 03/25/2018   MICROALBUR 0.5 01/15/2013   BP Readings from Last 3 Encounters:  04/21/18 122/74  03/13/18 128/84  02/10/18 124/78    ASSESSMENT AND PLAN:  Discussed the following assessment and plan:  Suspected UTI - Plan: POCT urinalysis dipstick, Culture, Urine  Right shoulder pain, unspecified chronicity  Fasting hyperglycemia  -Patient advised to return or notify health care team  if  new concerns arise.  Patient Instructions  Antibiotic for presumed UTI  Will send for culture and let you know results.   Try antiinflammatory for a 7-10 days  And if not improving consider  Get opnion from  sprts medicine  Think this is a mechanical cuase and not cardiovascular cause.   Attention to  lifestyle intervention healthy eating and exercise . And ROV in 6 months to check your hg a1c in office .  Consider adding metformin at any time.      Urinary Tract Infection, Adult  A urinary tract infection  (UTI) is an infection of any part of the urinary tract. The  urinary tract includes the kidneys, ureters, bladder, and urethra. These organs make, store, and get rid of urine in the body. Your health care provider may use other names to describe the infection. An upper UTI affects the ureters and kidneys (pyelonephritis). A lower UTI affects the bladder (cystitis) and urethra (urethritis). What are the causes? Most urinary tract infections are caused by bacteria in your genital area, around the entrance to your urinary tract (urethra). These bacteria grow and cause inflammation of your urinary tract. What increases the risk? You are more likely to develop this condition if:  You have a urinary catheter that stays in place (indwelling).  You are not able to control when you urinate or have a bowel movement (you have incontinence).  You are female and you: ? Use a spermicide or diaphragm for birth control. ? Have low estrogen levels. ? Are pregnant.  You have certain genes that increase your risk (genetics).  You are sexually active.  You take antibiotic medicines.  You have a condition that causes your flow of urine to slow down, such as: ? An enlarged prostate, if you are female. ? Blockage in your urethra (stricture). ? A kidney stone. ? A nerve condition that affects your bladder control (neurogenic bladder). ? Not getting enough to drink, or not urinating often.  You have certain medical conditions, such as: ? Diabetes. ? A weak disease-fighting system (immunesystem). ? Sickle cell disease. ? Gout. ? Spinal cord injury. What are the signs or symptoms? Symptoms of this condition include:  Needing to urinate right away (urgently).  Frequent urination or passing small amounts of urine frequently.  Pain or burning with urination.  Blood in the urine.  Urine that smells bad or unusual.  Trouble urinating.  Cloudy urine.  Vaginal discharge, if you are female.  Pain in  the abdomen or the lower back. You may also have:  Vomiting or a decreased appetite.  Confusion.  Irritability or tiredness.  A fever.  Diarrhea. The first symptom in older adults may be confusion. In some cases, they may not have any symptoms until the infection has worsened. How is this diagnosed? This condition is diagnosed based on your medical history and a physical exam. You may also have other tests, including:  Urine tests.  Blood tests.  Tests for sexually transmitted infections (STIs). If you have had more than one UTI, a cystoscopy or imaging studies may be done to determine the cause of the infections. How is this treated? Treatment for this condition includes:  Antibiotic medicine.  Over-the-counter medicines to treat discomfort.  Drinking enough water to stay hydrated. If you have frequent infections or have other conditions such as a kidney stone, you may need to see a health care provider who specializes in the urinary tract (urologist). In rare cases, urinary tract infections can cause sepsis. Sepsis is a life-threatening condition that occurs when the body responds to an infection. Sepsis is treated in the hospital with IV antibiotics, fluids, and other medicines. Follow these instructions at home:  Medicines  Take over-the-counter and prescription medicines only as told by your health care provider.  If you were prescribed an antibiotic medicine, take it as told by your health care provider. Do not stop using the antibiotic even if you start to feel better. General instructions  Make sure you: ? Empty your bladder often and completely. Do not hold urine for long periods of time. ? Empty your bladder after sex. ? Wipe from front to  back after a bowel movement if you are female. Use each tissue one time when you wipe.  Drink enough fluid to keep your urine pale yellow.  Keep all follow-up visits as told by your health care provider. This is  important. Contact a health care provider if:  Your symptoms do not get better after 1-2 days.  Your symptoms go away and then return. Get help right away if you have:  Severe pain in your back or your lower abdomen.  A fever.  Nausea or vomiting. Summary  A urinary tract infection (UTI) is an infection of any part of the urinary tract, which includes the kidneys, ureters, bladder, and urethra.  Most urinary tract infections are caused by bacteria in your genital area, around the entrance to your urinary tract (urethra).  Treatment for this condition often includes antibiotic medicines.  If you were prescribed an antibiotic medicine, take it as told by your health care provider. Do not stop using the antibiotic even if you start to feel better.  Keep all follow-up visits as told by your health care provider. This is important. This information is not intended to replace advice given to you by your health care provider. Make sure you discuss any questions you have with your health care provider. Document Released: 01/02/2005 Document Revised: 10/02/2017 Document Reviewed: 10/02/2017 Elsevier Interactive Patient Education  2019 [place].  Shoulder Pain Many things can cause shoulder pain, including:  An injury to the shoulder.  Overuse of the shoulder.  Arthritis. The source of the pain can be:  Inflammation.  An injury to the shoulder joint.  An injury to a tendon, ligament, or bone. Follow these instructions at home: Pay attention to changes in your symptoms. Let your health care provider know about them. Follow these instructions to relieve your pain. If you have a sling:  Wear the sling as told by your health care provider. Remove it only as told by your health care provider.  Loosen the sling if your fingers tingle, become numb, or turn cold and blue.  Keep the sling clean.  If the sling is not waterproof: ? Do not let it get wet. Remove it to shower or  bathe.  Move your arm as little as possible, but keep your hand moving to prevent swelling. Managing pain, stiffness, and swelling   If directed, put ice on the painful area: ? Put ice in a plastic bag. ? Place a towel between your skin and the bag. ? Leave the ice on for 20 minutes, 2-3 times per day. Stop applying ice if it does not help with the pain.  Squeeze a soft ball or a foam pad as much as possible. This helps to keep the shoulder from swelling. It also helps to strengthen the arm. General instructions  Take over-the-counter and prescription medicines only as told by your health care provider.  Keep all follow-up visits as told by your health care provider. This is important. Contact a health care provider if:  Your pain gets worse.  Your pain is not relieved with medicines.  New pain develops in your arm, hand, or fingers. Get help right away if:  Your arm, hand, or fingers: ? Tingle. ? Become numb. ? Become swollen. ? Become painful. ? Turn white or blue. Summary  Shoulder pain can be caused by an injury, overuse, or arthritis.  Pay attention to changes in your symptoms. Let your health care provider know about them.  This condition may be treated  with a sling, ice, and pain medicines.  Contact your health care provider if the pain gets worse or new pain develops. Get help right away if your arm, hand, or fingers tingle or become numb, swollen, or painful.  Keep all follow-up visits as told by your health care provider. This is important. This information is not intended to replace advice given to you by your health care provider. Make sure you discuss any questions you have with your health care provider. Document Released: 01/02/2005 Document Revised: 10/07/2017 Document Reviewed: 10/07/2017 Elsevier Interactive Patient Education  2019 [place]. Panosh M.D.

## 2018-04-21 NOTE — Progress Notes (Signed)
Chief Complaint  Patient presents with  . Shoulder Pain    x 3 weeks right shoulder pt states no injury   . Urinary Tract Infection    since sunday pt states there is burning and urgency     HPI: Melissa Brown 53 y.o. come in for sda    uti s hematuria    But also has another problem   Onset  Sunday 2 days  after noon  And then traces in   Urine and pressure  And lbp   No fever .No Chills   Shoulder  Pain right  For 3 weeks   After awakening one am   Pain and still presenting. Tried ibuprofen and some better short time.but reluctant to  Continue to take took total of   3 times  r hand dominant  No weakness or injury or numbness  ? About lab prediabetes  Findings  fam hx and bro is prediabetic    ROS: See pertinent positives and negatives per HPI.  Past Medical History:  Diagnosis Date  . Allergic    to grass and moldsby skin testing   . Angioedema of lips 12/07/2010   Hx of seem in August 12  remote hx of hives as a child.    . Breast cancer ([place]) 2005   left dcis  . Breast cancer ([place])   . Diabetes, gestational   . GESTATIONAL DIABETES 02/09/2007   Qualifier: History of  By: Regis Bill MD, Standley Brooking   . MVP (mitral valve prolapse)    ANTERIOR LEAFLET, MILD MR  . Palpitations    tachy, supressed with atenolol  . Scoliosis   . Thyroid disease    HYPERTHYROIDISM    Family History  Problem Relation Age of Onset  . Diabetes Mother   . Thyroid disease Mother   . Hypertension Mother   . Diabetes Father   . Heart disease Father   . Diabetes Other     Social History   Socioeconomic History  . Marital status: Married    Spouse name: Not on file  . Number of children: Not on file  . Years of education: Not on file  . Highest education level: Not on file  Occupational History  . Not on file  Social Needs  . Financial resource strain: Not on file  . Food insecurity:    Worry: Not on file    Inability: Not on file  . Transportation needs:    Medical: Not on file   Non-medical: Not on file  Tobacco Use  . Smoking status: Never Smoker  . Smokeless tobacco: Never Used  Substance and Sexual Activity  . Alcohol use: Yes    Alcohol/week: 1.0 standard drinks    Types: 1 Standard drinks or equivalent per week  . Drug use: No  . Sexual activity: Not on file  Lifestyle  . Physical activity:    Days per week: Not on file    Minutes per session: Not on file  . Stress: Not on file  Relationships  . Social connections:    Talks on phone: Not on file    Gets together: Not on file    Attends religious service: Not on file    Active member of club or organization: Not on file    Attends meetings of clubs or organizations: Not on file    Relationship status: Not on file  Other Topics Concern  . 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 cetirizine (ZYRTEC) 10 MG tablet Allertec ([employer] Brand) 10mg  daily    . Cholecalciferol (VITAMIN D-3 PO) Take by mouth.    . ferrous sulfate 325 (65 FE) MG tablet Take 325 mg by mouth daily with breakfast. During period    . fluocinonide-emollient (LIDEX-E) 0.05 % cream Apply 1 application topically 2 (two) times daily. Not on face 30 g 3  . montelukast (SINGULAIR) 10 MG tablet Take 1 tablet (10 mg total) by mouth at bedtime. 30 tablet 3  . Multiple Vitamins-Calcium (ONE-A-DAY WOMENS PO) Take by mouth daily.      Marland Kitchen doxycycline (VIBRA-TABS) 100 MG tablet Take 1 tablet (100 mg total) by mouth 2 (two) times daily. (Patient not taking: Reported on 04/21/2018) 14 tablet 0   No facility-administered medications prior to visit.      EXAM:  BP 122/74 (BP Location: Right Arm, Patient Position: Sitting, Cuff Size: Normal)   Pulse 95   Temp 98.2 F  (36.8 C) (Oral)   Ht 4\' 11"  (1.499 m)   Wt 127 lb 6.4 oz (57.8 kg)   LMP 03/30/2018   BMI 25.73 kg/m   Body mass index is 25.73 kg/m.  GENERAL: vitals reviewed and listed above, alert, oriented, appears well hydrated and in no acute distress HEENT: atraumatic, conjunctiva  clear, no obvious abnormalities on inspection of external nose and ears  NECK: no obvious masses on inspection palpation t CV: HRRR, no clubbing cyanosis or  peripheral edema nl cap refill  MS: moves all extremities without noticeable focal  Abnormality  Good rom shoulder  elevation helps stretch  Full rom no point tenderness .   PSYCH: pleasant and cooperative, no obvious depression or anxiety Lab Results  Component Value Date   WBC 4.6 03/25/2018   HGB 13.7 03/25/2018   HCT 40.0 03/25/2018   PLT 299.0 03/25/2018   GLUCOSE 112 (H) 03/25/2018   CHOL 222 (H) 03/25/2018   TRIG 157.0 (H) 03/25/2018   HDL 48.90 03/25/2018   LDLDIRECT 166.0 01/29/2016   LDLCALC 142 (H) 03/25/2018   ALT 20 03/25/2018   AST 17 03/25/2018   NA 137 03/25/2018   K 4.4 03/25/2018   CL 102 03/25/2018   CREATININE 0.69 03/25/2018   BUN 14 03/25/2018   CO2 27 03/25/2018   TSH 0.95 03/25/2018   HGBA1C 6.0 03/25/2018   MICROALBUR 0.5 01/15/2013   BP Readings from Last 3 Encounters:  04/21/18 122/74  03/13/18 128/84  02/10/18 124/78  ua pos leuk glucose   ASSESSMENT AND PLAN:  Discussed the following assessment and plan:  Suspected UTI - Plan: POCT urinalysis dipstick, Culture, Urine  Right shoulder pain, unspecified chronicity  Fasting hyperglycemia  u cx add antibiotic  mobic for shoulder  options for pre diabetes readings at risk disc metformin options  For now lsi and fu ov in 6 mos  To check a1c then   Metformin 500 an options  -Patient advised to return or notify health care team  if  new concerns arise.  Patient Instructions  Antibiotic for presumed UTI  Will send for culture and let you know results.  Try  antiinflammatory for a 7-10 days  And if not improving consider  Get opnion from  sprts medicine  Think this is a mechanical cuase and not cardiovascular cause.   Attention to  lifestyle intervention healthy eating and exercise . And ROV in 6 months to check your hg a1c in office .  Consider adding metformin at any time.      Urinary Tract Infection, Adult  A urinary tract infection (UTI) is an infection of any part of the urinary tract. The urinary tract includes the kidneys, ureters, bladder, and urethra. These organs make, store, and get rid of urine in the body. Your health care provider may use other names to describe the infection. An upper UTI affects the ureters and kidneys (pyelonephritis). A lower UTI affects the bladder (cystitis) and urethra (urethritis). What are the causes? Most urinary tract infections are caused by bacteria in your genital area, around the entrance to your urinary tract (urethra). These bacteria grow and cause inflammation of your urinary tract. What increases the risk? You are more likely to develop this condition if:  You have a urinary catheter that stays in place (indwelling).  You are not able to control when you urinate or have a bowel movement (you have incontinence).  You are female and you: ? Use a spermicide or diaphragm for birth control. ? Have low estrogen levels. ? Are pregnant.  You have certain genes that increase your risk (genetics).  You are sexually active.  You take antibiotic medicines.  You have a condition that causes your flow of urine to slow down, such as: ? An enlarged prostate, if you are female. ? Blockage in your urethra (stricture). ? A kidney stone. ? A nerve condition that affects your bladder control (neurogenic bladder). ? Not getting enough to drink, or not urinating often.  You have certain medical conditions, such as: ? Diabetes. ? A weak disease-fighting system (immunesystem). ? Sickle cell  disease. ? Gout. ? Spinal cord injury. What are the signs or symptoms? Symptoms of this condition include:  Needing to urinate right away (urgently).  Frequent urination or passing small amounts of urine frequently.  Pain or burning with urination.  Blood in the urine.  Urine that smells bad or unusual.  Trouble urinating.  Cloudy urine.  Vaginal discharge, if you are female.  Pain in the abdomen or the lower back. You may also have:  Vomiting or a decreased appetite.  Confusion.  Irritability or tiredness.  A fever.  Diarrhea. The first symptom in older adults may be confusion. In some cases, they may not have any symptoms until the infection has worsened. How is this diagnosed? This condition is diagnosed based on your medical history and a physical exam. You may also have other tests, including:  Urine tests.  Blood tests.  Tests for sexually transmitted infections (STIs). If you have had more than one UTI, a cystoscopy or imaging studies may be done to determine the cause of the infections. How is this treated? Treatment for this condition includes:  Antibiotic medicine.  Over-the-counter medicines to treat discomfort.  Drinking enough water to stay hydrated. If you have frequent infections or have other conditions such as a kidney stone, you may need to see a health care provider who specializes in the urinary tract (urologist). In rare cases, urinary tract infections can cause sepsis. Sepsis is a life-threatening condition that occurs when the body responds to an infection. Sepsis is treated in the hospital with IV  antibiotics, fluids, and other medicines. Follow these instructions at home:  Medicines  Take over-the-counter and prescription medicines only as told by your health care provider.  If you were prescribed an antibiotic medicine, take it as told by your health care provider. Do not stop using the antibiotic even if you start to feel  better. General instructions  Make sure you: ? Empty your bladder often and completely. Do not hold urine for long periods of time. ? Empty your bladder after sex. ? Wipe from front to back after a bowel movement if you are female. Use each tissue one time when you wipe.  Drink enough fluid to keep your urine pale yellow.  Keep all follow-up visits as told by your health care provider. This is important. Contact a health care provider if:  Your symptoms do not get better after 1-2 days.  Your symptoms go away and then return. Get help right away if you have:  Severe pain in your back or your lower abdomen.  A fever.  Nausea or vomiting. Summary  A urinary tract infection (UTI) is an infection of any part of the urinary tract, which includes the kidneys, ureters, bladder, and urethra.  Most urinary tract infections are caused by bacteria in your genital area, around the entrance to your urinary tract (urethra).  Treatment for this condition often includes antibiotic medicines.  If you were prescribed an antibiotic medicine, take it as told by your health care provider. Do not stop using the antibiotic even if you start to feel better.  Keep all follow-up visits as told by your health care provider. This is important. This information is not intended to replace advice given to you by your health care provider. Make sure you discuss any questions you have with your health care provider. Document Released: 01/02/2005 Document Revised: 10/02/2017 Document Reviewed: 10/02/2017 Elsevier Interactive Patient Education  2019 [employer].  Shoulder Pain Many things can cause shoulder pain, including:  An injury to the shoulder.  Overuse of the shoulder.  Arthritis. The source of the pain can be:  Inflammation.  An injury to the shoulder joint.  An injury to a tendon, ligament, or bone. Follow these instructions at home: Pay attention to changes in your symptoms. Let your  health care provider know about them. Follow these instructions to relieve your pain. If you have a sling:  Wear the sling as told by your health care provider. Remove it only as told by your health care provider.  Loosen the sling if your fingers tingle, become numb, or turn cold and blue.  Keep the sling clean.  If the sling is not waterproof: ? Do not let it get wet. Remove it to shower or bathe.  Move your arm as little as possible, but keep your hand moving to prevent swelling. Managing pain, stiffness, and swelling   If directed, put ice on the painful area: ? Put ice in a plastic bag. ? Place a towel between your skin and the bag. ? Leave the ice on for 20 minutes, 2-3 times per day. Stop applying ice if it does not help with the pain.  Squeeze a soft ball or a foam pad as much as possible. This helps to keep the shoulder from swelling. It also helps to strengthen the arm. General instructions  Take over-the-counter and prescription medicines only as told by your health care provider.  Keep all follow-up visits as told by your health care provider. This is important. Contact a  health care provider if:  Your pain gets worse.  Your pain is not relieved with medicines.  New pain develops in your arm, hand, or fingers. Get help right away if:  Your arm, hand, or fingers: ? Tingle. ? Become numb. ? Become swollen. ? Become painful. ? Turn white or blue. Summary  Shoulder pain can be caused by an injury, overuse, or arthritis.  Pay attention to changes in your symptoms. Let your health care provider know about them.  This condition may be treated with a sling, ice, and pain medicines.  Contact your health care provider if the pain gets worse or new pain develops. Get help right away if your arm, hand, or fingers tingle or become numb, swollen, or painful.  Keep all follow-up visits as told by your health care provider. This is important. This information is not  intended to replace advice given to you by your health care provider. Make sure you discuss any questions you have with your health care provider. Document Released: 01/02/2005 Document Revised: 10/07/2017 Document Reviewed: 10/07/2017 Elsevier Interactive Patient Education  2019 Cheraw K. Panosh M.D.

## 2018-04-24 LAB — URINE CULTURE
MICRO NUMBER: 53024
SPECIMEN QUALITY:: ADEQUATE

## 2018-04-24 NOTE — Progress Notes (Signed)
urine culture shows e coli  sensitive to medication given . Should resolve with current treatment .FU if not better. 

## 2018-05-18 ENCOUNTER — Other Ambulatory Visit: Payer: Self-pay | Admitting: Internal Medicine

## 2018-05-26 DIAGNOSIS — Z01419 Encounter for gynecological examination (general) (routine) without abnormal findings: Secondary | ICD-10-CM | POA: Diagnosis not present

## 2018-05-27 ENCOUNTER — Other Ambulatory Visit: Payer: Self-pay

## 2018-05-27 DIAGNOSIS — M25511 Pain in right shoulder: Secondary | ICD-10-CM

## 2018-05-27 NOTE — Telephone Encounter (Signed)
Please send in referral  As patient  Noted   And niotify her when this this is  done thanks

## 2018-06-04 ENCOUNTER — Other Ambulatory Visit: Payer: Self-pay

## 2018-06-04 DIAGNOSIS — M25511 Pain in right shoulder: Secondary | ICD-10-CM

## 2018-10-16 ENCOUNTER — Telehealth: Payer: Self-pay | Admitting: *Deleted

## 2018-10-16 NOTE — Telephone Encounter (Signed)
Copied from [place] [id number]. Topic: General - Other >> Oct 16, 2018  8:32 AM Keene Breath wrote: Reason for CRM: Patient called to cancel appt.  Tried office but they were busy.  CB# [phone]  Clinic RN notified patient appointment has been canceled. Patient verbalized she will call back when she is ready to reschedule.

## 2018-10-20 ENCOUNTER — Ambulatory Visit: Payer: BLUE CROSS/BLUE SHIELD | Admitting: Internal Medicine

## 2019-02-16 ENCOUNTER — Other Ambulatory Visit: Payer: Self-pay

## 2019-02-16 ENCOUNTER — Encounter: Payer: BLUE CROSS/BLUE SHIELD | Admitting: Internal Medicine

## 2019-02-16 ENCOUNTER — Telehealth (INDEPENDENT_AMBULATORY_CARE_PROVIDER_SITE_OTHER): Payer: BLUE CROSS/BLUE SHIELD | Admitting: Internal Medicine

## 2019-02-16 ENCOUNTER — Encounter: Payer: Self-pay | Admitting: Internal Medicine

## 2019-02-16 DIAGNOSIS — J329 Chronic sinusitis, unspecified: Secondary | ICD-10-CM | POA: Diagnosis not present

## 2019-02-16 DIAGNOSIS — R05 Cough: Secondary | ICD-10-CM

## 2019-02-16 DIAGNOSIS — R058 Other specified cough: Secondary | ICD-10-CM

## 2019-02-16 DIAGNOSIS — J302 Other seasonal allergic rhinitis: Secondary | ICD-10-CM

## 2019-02-16 MED ORDER — DOXYCYCLINE HYCLATE 100 MG PO TABS: 100.0000 mg | ORAL_TABLET | Freq: Two times a day (BID) | ORAL | 0 refills | Status: DC

## 2019-02-16 MED ORDER — PREDNISONE 20 MG PO TABS: 20.0000 mg | ORAL_TABLET | Freq: Two times a day (BID) | ORAL | 0 refills | Status: DC

## 2019-02-16 NOTE — Progress Notes (Signed)
Virtual Visit via Video Note  I connected with@ on 02/16/19 at  3:00 PM EST by a video enabled telemedicine application and verified that I am speaking with the correct person using two identifiers. Location patient: home Location provider:work  office Persons participating in the virtual visit: patient, provider  WIth national recommendations  regarding COVID 19 pandemic   video visit is advised over in office visit for this patient.  Patient aware  of the limitations of evaluation and management by telemedicine and  availability of in person appointments. and agreed to proceed.   HPI: Melissa Brown presents for video visit  Her camera not working well but   Can see a picture  And audio is good receptions  We proceeded    Anyway  Onset 3 weeks ago  With  congestion sinus drainage   pnd and nasal   Using  Flonase saline netti pit and  Allergy med with some help but now getting worse  And mucous increasing   Has a cough fur no wheeze or sob   Has sinus pressure but no serious pain and mucous  Is white . In past had doxy and pred  Last Nov  And helped .    Seems to get this in the fall  Season yearly   Remote allergy eval and allergic to molds  On leaves?   Doesn't work outside and has windows closed .   ROS: See pertinent positives and negatives per HPI.  Past Medical History:  Diagnosis Date  . Allergic    to grass and moldsby skin testing   . Angioedema of lips 12/07/2010   Hx of seem in August 12  remote hx of hives as a child.    . Breast cancer ([facility]) 2005   left dcis  . Breast cancer ([place])   . Diabetes, gestational   . GESTATIONAL DIABETES 02/09/2007   Qualifier: History of  By: Regis Bill MD, Standley Brooking   . MVP (mitral valve prolapse)    ANTERIOR LEAFLET, MILD MR  . Palpitations    tachy, supressed with atenolol  . Scoliosis   . Thyroid disease    HYPERTHYROIDISM    Past Surgical History:  Procedure Laterality Date  . BREAST LUMPECTOMY     radiation DCIS- reconstruction left   . CESAREAN SECTION      Family History  Problem Relation Age of Onset  . Diabetes Mother   . Thyroid disease Mother   . Hypertension Mother   . Diabetes Father   . Heart disease Father   . Diabetes Other     Social History   Tobacco Use  . Smoking status: Never Smoker  . Smokeless tobacco: Never Used  Substance Use Topics  . Alcohol use: Yes    Alcohol/week: 1.0 standard drinks    Types: 1 Standard drinks or equivalent per week  . Drug use: No      Current Outpatient Medications:  .  cetirizine (ZYRTEC) 10 MG tablet, Allertec ([employer] Brand) 10mg  daily, Disp: , Rfl:  .  Cholecalciferol (VITAMIN D-3 PO), Take by mouth., Disp: , Rfl:  .  doxycycline (VIBRA-TABS) 100 MG tablet, Take 1 tablet (100 mg total) by mouth 2 (two) times daily. For sinsusitis, Disp: 14 tablet, Rfl: 0 .  ferrous sulfate 325 (65 FE) MG tablet, Take 325 mg by mouth daily with breakfast. During period, Disp: , Rfl:  .  fluocinonide-emollient (LIDEX-E) 0.05 % cream, Apply 1 application topically 2 (two) times daily. Not on face,  Disp: 30 g, Rfl: 3 .  meloxicam (MOBIC) 15 MG tablet, TAKE 1 TABLET (15 MG TOTAL) BY MOUTH DAILY. FOR 7-10 DAYS OR AS DIRECTED, Disp: 30 tablet, Rfl: 0 .  montelukast (SINGULAIR) 10 MG tablet, Take 1 tablet (10 mg total) by mouth at bedtime., Disp: 30 tablet, Rfl: 3 .  Multiple Vitamins-Calcium (ONE-A-DAY WOMENS PO), Take by mouth daily.  , Disp: , Rfl:  .  predniSONE (DELTASONE) 20 MG tablet, Take 1 tablet (20 mg total) by mouth 2 (two) times daily. For allergic sinusitis, Disp: 10 tablet, Rfl: 0  EXAM: BP Readings from Last 3 Encounters:  04/21/18 122/74  03/13/18 128/84  02/10/18 124/78    VITALS per patient if applicable: tep [id number] today   GENERAL: alert, oriented, appears well and in no acute distress  HEENT: atraumatic, conjunttiva clear, no obvious abnormalities on inspection of external nose and ears sounds congested but no stridor resp distress   NECK: normal  movements of the head and neck  LUNGS: no signs of respiratory distress, breathing rate appears normal, no obvious gross SOB, gasping or wheezing  PSYCH/NEURO: pleasant and cooperative, no obvious depression or anxiety, speech and thought processing grossly intact Lab Results  Component Value Date   WBC 4.6 03/25/2018   HGB 13.7 03/25/2018   HCT 40.0 03/25/2018   PLT 299.0 03/25/2018   GLUCOSE 112 (H) 03/25/2018   CHOL 222 (H) 03/25/2018   TRIG 157.0 (H) 03/25/2018   HDL 48.90 03/25/2018   LDLDIRECT 166.0 01/29/2016   LDLCALC 142 (H) 03/25/2018   ALT 20 03/25/2018   AST 17 03/25/2018   NA 137 03/25/2018   K 4.4 03/25/2018   CL 102 03/25/2018   CREATININE 0.69 03/25/2018   BUN 14 03/25/2018   CO2 27 03/25/2018   TSH 0.95 03/25/2018   HGBA1C 6.0 03/25/2018   MICROALBUR 0.5 01/15/2013    ASSESSMENT AND PLAN:  Discussed the following assessment and plan:    ICD-10-CM   1. Sinusitis, unspecified chronicity, unspecified location  J32.9   2. Seasonal allergic rhinitis, unspecified trigger  J30.2   3. Respiratory tract congestion with cough  R05    prolonged 3 weeks and getting worse    Prolonged ur sx  With hx of sinusitis   And allergic rhinitis  Do not suspect covid   Other  Suspect seasonal allergy trigger and secondary flare  She is using  Reg sinus hygiene add empiric  Doxy and prednisone  (  Can take 3 -5 days) continue same medication   If  persistent or progressive and or recurrence seasonal consider   Allergy reeval as  Previous eval was  Years ago  She can let us know about fu and allergy referral  Counseled.   Expectant management and discussion of plan and treatment with opportunity to ask questions and all were answered. The patient agreed with the plan and demonstrated an understanding of the instructions.   Advised to call back or seek an in-person evaluation if worsening  or having  further concerns .  Shanon Ace, MD

## 2019-02-16 NOTE — Telephone Encounter (Signed)
Pt has been scheduled virtual visit

## 2019-06-22 DIAGNOSIS — Z01419 Encounter for gynecological examination (general) (routine) without abnormal findings: Secondary | ICD-10-CM | POA: Diagnosis not present

## 2019-06-22 DIAGNOSIS — Z23 Encounter for immunization: Secondary | ICD-10-CM | POA: Diagnosis not present

## 2019-06-22 DIAGNOSIS — E559 Vitamin D deficiency, unspecified: Secondary | ICD-10-CM | POA: Diagnosis not present

## 2019-06-22 DIAGNOSIS — E049 Nontoxic goiter, unspecified: Secondary | ICD-10-CM | POA: Diagnosis not present

## 2019-06-24 DIAGNOSIS — E049 Nontoxic goiter, unspecified: Secondary | ICD-10-CM | POA: Diagnosis not present

## 2019-06-24 DIAGNOSIS — E559 Vitamin D deficiency, unspecified: Secondary | ICD-10-CM | POA: Diagnosis not present

## 2019-06-24 DIAGNOSIS — Z01419 Encounter for gynecological examination (general) (routine) without abnormal findings: Secondary | ICD-10-CM | POA: Diagnosis not present

## 2019-06-24 DIAGNOSIS — Z1159 Encounter for screening for other viral diseases: Secondary | ICD-10-CM | POA: Diagnosis not present

## 2019-06-25 DIAGNOSIS — R000 Tachycardia, unspecified: Secondary | ICD-10-CM | POA: Diagnosis not present

## 2019-06-25 LAB — CBC AND DIFFERENTIAL
HCT: 43 (ref 36–46)
Hemoglobin: 14.2 (ref 12.0–16.0)
Platelets: 308 (ref 150–399)
WBC: 6.5

## 2019-06-25 LAB — CBC: RBC: 4.54 (ref 3.87–5.11)

## 2019-06-25 LAB — TSH: TSH: 0.74 (ref <=5.90)

## 2019-06-25 LAB — VITAMIN D 25 HYDROXY (VIT D DEFICIENCY, FRACTURES): Vit D, 25-Hydroxy: 46

## 2019-06-25 LAB — HEPATIC FUNCTION PANEL
ALT: 24 (ref 7–35)
AST: 24 (ref 13–35)
Bilirubin, Total: 0.3

## 2019-06-25 LAB — BASIC METABOLIC PANEL
BUN: 18 (ref 4–21)
Creatinine: 0.8 (ref <=1.1)
Glucose: 45
Sodium: 132 — AB (ref 137–147)

## 2019-06-25 LAB — HM HEPATITIS C SCREENING LAB: HM Hepatitis Screen: NEGATIVE

## 2019-06-25 LAB — HEMOGLOBIN A1C: Hemoglobin A1C: 6.1

## 2019-06-25 LAB — LIPID PANEL: LDL Cholesterol: 186

## 2019-07-09 ENCOUNTER — Ambulatory Visit: Payer: BLUE CROSS/BLUE SHIELD | Attending: Internal Medicine

## 2019-07-09 DIAGNOSIS — Z23 Encounter for immunization: Secondary | ICD-10-CM

## 2019-07-09 NOTE — Progress Notes (Signed)
   Covid-19 Vaccination Clinic  Name:  Melissa Brown    MRN: [id number] DOB: [birth year]  07/09/2019  Melissa Brown was observed post Covid-19 immunization for 15 minutes without incident. She was provided with Vaccine Information Sheet and instruction to access the V-Safe system.   Melissa Brown was instructed to call 911 with any severe reactions post vaccine: Marland Kitchen Difficulty breathing  . Swelling of face and throat  . A fast heartbeat  . A bad rash all over body  . Dizziness and weakness   Immunizations Administered    Name Date Dose VIS Date Route   Pfizer COVID-19 Vaccine 07/09/2019  3:24 PM 0.3 mL 03/19/2019 Intramuscular   Manufacturer: [employer], [employer]   Lot: [id number]   [place]: [id number]

## 2019-07-18 NOTE — Progress Notes (Signed)
This visit occurred during the SARS-CoV-2 public health emergency.  Safety protocols were in place, including screening questions prior to the visit, additional usage of staff PPE, and extensive cleaning of exam room while observing appropriate contact time as indicated for disinfecting solutions.    Chief Complaint  Patient presents with  . Annual Exam  . Follow-up    goiter  . Hyperlipidemia    HPI: Patient  Melissa Brown  54 y.o. comes in today for [place] visit  Last seen a few years ago .  She has had recent onset of  Goiter  Mostly on right side   Tickle  cough at times  When lays down tried omeprazole x 2 weeks  May be min help    To see ENT at [facility]  For the goiter  Soon   Blood labs at her gyne showed nl a1c 6.1 but very high NF lipids . TC 274 ldl186 TG 441 hdl 49vit d 46 bg 78 creat 0.64   Health Maintenance  Topic Date Due  . PNEUMOCOCCAL POLYSACCHARIDE VACCINE AGE 35-64 HIGH RISK  Never done  . FOOT EXAM  Never done  . HIV Screening  Never done  . URINE MICROALBUMIN  01/15/2014  . OPHTHALMOLOGY EXAM  07/20/2018  . HEMOGLOBIN A1C  09/24/2018  . INFLUENZA VACCINE  11/07/2019  . MAMMOGRAM  01/11/2020  . PAP SMEAR-Modifier  06/22/2022  . COLONOSCOPY  07/20/2026  . TETANUS/TDAP  07/19/2029   Health Maintenance Review LIFESTYLE:  Exercise:  Once a week  Maybe aerobic  Tobacco/ETS: no Alcohol:  ocass Sugar beverages:no Sleep: around  6-7  Drug use: no HH of  3  1 dog  Work: 45 hours  Remote     ROS:  GEN/ HEENT: No fever, significant weight changes sweats headaches vision problems hearing changes, CV/ PULM; No chest pain shortness of breath cough, syncope,edema  change in exercise tolerance. GI /GU: No adominal pain, vomiting, change in bowel habits. No blood in the stool. No significant GU symptoms. SKIN/HEME: ,no acute skin rashes suspicious lesions or bleeding. No lymphadenopathy, nodules, masses.  NEURO/ PSYCH:  No neurologic signs such as  weakness numbness. No depression anxiety. IMM/ Allergy: No unusual infections.  Allergy .   REST of 12 system review negative except as per HPI   Past Medical History:  Diagnosis Date  . Allergic    to grass and moldsby skin testing   . Angioedema of lips 12/07/2010   Hx of seem in August 12  remote hx of hives as a child.    . Breast cancer ([place]) 2005   left dcis  . Breast cancer ([place])   . Diabetes, gestational   . GESTATIONAL DIABETES 02/09/2007   Qualifier: History of  By: Regis Bill MD, Standley Brooking   . MVP (mitral valve prolapse)    ANTERIOR LEAFLET, MILD MR  . Palpitations    tachy, supressed with atenolol  . Scoliosis   . Thyroid disease    HYPERTHYROIDISM    Past Surgical History:  Procedure Laterality Date  . BREAST LUMPECTOMY     radiation DCIS- reconstruction left  . CESAREAN SECTION      Family History  Problem Relation Age of Onset  . Diabetes Mother   . Thyroid disease Mother   . Hypertension Mother   . Diabetes Father   . Heart disease Father   . Diabetes Other     Social History   Socioeconomic History  . Marital status: Married  Spouse name: Not on file  . Number of children: Not on file  . Years of education: Not on file  . Highest education level: Not on file  Occupational History  . Not on file  Tobacco Use  . Smoking status: Never Smoker  . Smokeless tobacco: Never Used  Substance and Sexual Activity  . Alcohol use: Yes    Alcohol/week: 1.0 standard drinks    Types: 1 Standard drinks or equivalent per week  . Drug use: No  . Sexual activity: Not on file  Other Topics Concern  . 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 Difficulty of Paying Living Expenses:   Food  Insecurity:   . Worried About Charity fundraiser in the Last Year:   . Arboriculturist in the Last Year:   Transportation Needs:   . Film/video editor (Medical):   Marland Kitchen Lack of Transportation (Non-Medical):   Physical Activity:   . Days of Exercise per Week:   . Minutes of Exercise per Session:   Stress:   . Feeling of Stress :   Social Connections:   . Frequency of Communication with Friends and Family:   . Frequency of Social Gatherings with Friends and Family:   . Attends Religious Services:   . Active Member of Clubs or Organizations:   . Attends Archivist Meetings:   Marland Kitchen Marital Status:        EXAM:  BP 118/80 (BP Location: Right Arm, Patient Position: Sitting, Cuff Size: Normal)   Pulse 83   Temp (!) 97.4 F (36.3 C) (Temporal)   Ht 4' 11.5" (1.511 m)   Wt 124 lb 12.8 oz (56.6 kg)   LMP 04/22/2019 (Exact Date)   SpO2 99%   BMI 24.78 kg/m   Body mass index is 24.78 kg/m. Wt Readings from Last 3 Encounters:  07/20/19 124 lb 12.8 oz (56.6 kg)  04/21/18 127 lb 6.4 oz (57.8 kg)  03/13/18 131 lb 1.6 oz (59.5 kg)    Physical Exam: Vital signs reviewed [id number] is a well-developed well-nourished alert cooperative    who appearsr stated age in no acute distress.  HEENT: normocephalic atraumatic , Eyes: PERRL EOM's full, conjunctiva clear, Nares: paten,t no deformity discharge or tenderness., Ears: no deformity EAC's clear TMs with normal landmarks. Mouth: clear OP,masked . NECK: supple thyroio megaly firm goiter right larger than left  CHEST/PULM:  Clear to auscultation and percussion breath sounds equal no wheeze , rales or rhonchi. No chest wall deformities or tenderness. Breast:  Rnormal by inspection . No dimpling, discharge, masses, tenderness or discharge . Left surgical scar  No masses  CV: PMI is nondisplaced, S1 S2 no gallops, murmurs, rubs. Peripheral pulses are full without delay.No JVD .  ABDOMEN: Bowel sounds normal nontender  No guard or  rebound, no hepato splenomegal no CVA tenderness.  . Extremtities:  No clubbing cyanosis or edema, no acute joint swelling or redness no focal atrophy NEURO:  Oriented x3, cranial nerves 3-12 appear to be intact, no obvious focal weakness,gait within normal limits no abnormal reflexes or asymmetrical SKIN:  No acute rashes normal turgor, color, no bruising or petechiae. PSYCH: Oriented, good eye contact, no obvious depression anxiety, cognition and judgment appear normal. LN: no cervical axillary inguinal adenopathy  Lab Results  Component Value Date   WBC 4.6 03/25/2018   HGB 13.7 03/25/2018   HCT 40.0 03/25/2018   PLT 299.0 03/25/2018   GLUCOSE 106 (H) 07/20/2019   CHOL 271 (H) 07/20/2019   TRIG 240.0 (H) 07/20/2019   HDL 52.50 07/20/2019   LDLDIRECT 177.0 07/20/2019   LDLCALC 142 (H) 03/25/2018   ALT 20 03/25/2018   AST 17 03/25/2018   NA 137 07/20/2019   K 4.2 07/20/2019   CL 100 07/20/2019   CREATININE 0.64 07/20/2019   BUN 14 07/20/2019   CO2 29 07/20/2019   TSH 1.22 07/20/2019   HGBA1C 6.0 03/25/2018   MICROALBUR 0.5 01/15/2013    BP Readings from Last 3 Encounters:  07/20/19 118/80  04/21/18 122/74  03/13/18 128/84  Lab results reviewed with patient  From [facility]  [place]:  Discussed the following assessment and plan:    ICD-10-CM   1. Visit for preventive health examination  123456 Basic metabolic panel    Lipid panel    TSH    T4, free    T3, free    Thyroid antibodies  2. Hyperlipidemia, unspecified hyperlipidemia type  99991111 Basic metabolic panel    Lipid panel    TSH    T4, free    T3, free    Thyroid antibodies    DG Chest 2 View    DG Chest 2 View   high nf check fasting today   3. Goiter  123456 Basic metabolic panel    Lipid panel    TSH    T4, free    T3, free    Thyroid antibodies    DG Chest 2 View    DG Chest 2 View   to get [place] this week and then ent eval  4. Cough, persistent  [id number] Basic metabolic panel     Lipid panel    TSH    T4, free    T3, free    Thyroid antibodies    DG Chest 2 View    DG Chest 2 View   sound upper airway poss elr or from thryoid poss   5. Need for Tdap vaccination  Z23 Tdap vaccine greater than or equal to 7yo IM  The 10-year ASCVD risk score Mikey Bussing DC Brooke Bonito., et al., 2013) is: 4.3%   Values used to calculate the score:     Age: 70 years     Sex: Female     Is Non-Hispanic African American: No     Diabetic: Yes     Tobacco smoker: No     Systolic Blood Pressure: 123456 mmHg     Is BP treated: No     HDL Cholesterol: 52.5 mg/dL     Total Cholesterol: 271 mg/dL   Disc  proceeding the thyroid eval  And then decide on lipid intervention  Last cxray 2019  Will update today cause of cough  Return for depending on results or yearly .  Patient Care Team: Skya Mccullum, Standley Brooking, MD as PCP - Carrolyn Meiers, MD as Referring Physician (Obstetrics and Gynecology) Lindon Romp, MD as Consulting Physician (Internal Medicine) Patient Instructions   Have ENT evaluate for  Cough and silent reflux as well as  The.lsi  goiter.  Will notify  you  of labs when available.   Attention to  lifestyle intervention healthy eating and exercise .    High Triglycerides Eating Plan Triglycerides are a type of fat in the blood. High levels of triglycerides can increase your risk of heart disease and stroke. If your triglyceride levels are high, choosing the right foods can help lower your triglycerides and keep your heart healthy. Work with your health care provider or a diet and nutrition specialist (dietitian) to develop an eating plan that is right for you. What are tips for following this plan? General guidelines   Lose weight, if you are overweight. For most people, losing 5-10 lbs (2-5 kg) helps lower triglyceride levels. A weight-loss plan may include. ? 30 minutes of exercise at least 5 days a week. ? Reducing the amount of calories, sugar, and fat you eat.  Eat a  wide variety of fresh fruits, vegetables, and whole grains. These foods are high in fiber.  Eat foods that contain healthy fats, such as fatty fish, nuts, seeds, and olive oil.  Avoid foods that are high in added sugar, added salt (sodium), saturated fat, and trans fat.  Avoid low-fiber, refined carbohydrates such as white bread, crackers, noodles, and white rice.  Avoid foods with partially hydrogenated oils (trans fats), such as fried foods or stick margarine.  Limit alcohol intake to no more than 1 drink a day for nonpregnant women and 2 drinks a day for men. One drink equals 12 oz of beer, 5 oz of wine, or 1 oz of hard liquor. Your health care provider may recommend that you drink less depending on your overall health. Reading food labels  Check food labels for the amount of saturated fat. Choose foods with no or very little saturated fat.  Check food labels for the amount of trans fat. Choose foods with no trans fat.  Check food labels for the amount of cholesterol. Choose foods low in cholesterol. Ask your dietitian how much cholesterol you should have each day.  Check food labels for the amount of sodium. Choose foods with less than 140 milligrams (mg) per serving. Shopping  Buy dairy products labeled as nonfat (skim) or low-fat (1%).  Avoid buying processed or prepackaged foods. These are often high in added sugar, sodium, and fat. Cooking  Choose healthy fats when cooking, such as olive oil or canola oil.  Cook foods using lower fat methods, such as baking, broiling, boiling, or grilling.  Make your own sauces, dressings, and marinades when possible, instead of buying them. Store-bought sauces, dressings, and marinades are often high in sodium and sugar. Meal planning  Eat more home-cooked food and less restaurant, buffet, and fast food.  Eat fatty fish at least 2 times each week. Examples of fatty fish include salmon, trout, mackerel, tuna, and herring.  If you eat  whole eggs, do not eat more than 3 egg yolks per week. What foods are recommended? The items listed may not be a complete list. Talk with your dietitian about what dietary choices are best for you. Grains Whole wheat or whole grain breads, crackers, cereals, and pasta. Unsweetened oatmeal. Bulgur. Barley. Quinoa. Brown rice. Whole wheat flour tortillas. Vegetables Fresh or frozen vegetables. Low-sodium canned vegetables. Fruits All fresh, canned (in natural juice), or frozen fruits. Meats and other protein foods Skinless chicken or [place]. Ground chicken or [place]. Lean cuts of pork, trimmed of fat. Fish and seafood, especially salmon, trout, and herring. Egg whites. Dried beans, peas, or lentils. Unsalted  nuts or seeds. Unsalted canned beans. Natural peanut or almond butter. Dairy Low-fat dairy products. Skim or low-fat (1%) milk. Reduced fat (2%) and low-sodium cheese. Low-fat ricotta cheese. Low-fat cottage cheese. Plain, low-fat yogurt. Fats and oils Tub margarine without trans fats. Light or reduced-fat mayonnaise. Light or reduced-fat salad dressings. Avocado. Safflower, olive, sunflower, soybean, and canola oils. What foods are not recommended? The items listed may not be a complete list. Talk with your dietitian about what dietary choices are best for you. Grains White bread. White (regular) pasta. White rice. Cornbread. Bagels. Pastries. Crackers that contain trans fat. Vegetables Creamed or fried vegetables. Vegetables in a cheese sauce. Fruits Sweetened dried fruit. Canned fruit in syrup. Fruit juice. Meats and other protein foods Fatty cuts of meat. Ribs. Chicken wings. Berniece Salines. Sausage. Bologna. Salami. Chitterlings. Fatback. Hot dogs. Bratwurst. Packaged lunch meats. Dairy Whole or reduced-fat (2%) milk. Half-and-half. Cream cheese. Full-fat or sweetened yogurt. Full-fat cheese. Nondairy creamers. Whipped toppings. Processed cheese or cheese spreads. Cheese  curds. Beverages Alcohol. Sweetened drinks, such as soda, lemonade, fruit drinks, or punches. Fats and oils Butter. Stick margarine. Lard. Shortening. Ghee. Bacon fat. Tropical oils, such as coconut, palm kernel, or palm oils. Sweets and desserts Corn syrup. Sugars. Honey. Molasses. Candy. Jam and jelly. Syrup. Sweetened cereals. Cookies. Pies. Cakes. Donuts. Muffins. Ice cream. Condiments Store-bought sauces, dressings, and marinades that are high in sugar, such as ketchup and barbecue sauce. Summary  High levels of triglycerides can increase the risk of heart disease and stroke. Choosing the right foods can help lower your triglycerides.  Eat plenty of fresh fruits, vegetables, and whole grains. Choose low-fat dairy and lean meats. Eat fatty fish at least twice a week.  Avoid processed and prepackaged foods with added sugar, sodium, saturated fat, and trans fat.  If you need suggestions or have questions about what types of food are good for you, talk with your health care provider or a dietitian. This information is not intended to replace advice given to you by your health care provider. Make sure you discuss any questions you have with your health care provider. Document Revised: 03/07/2017 Document Reviewed: 05/28/2016 Elsevier Patient Education  2020 [place] Nicklaus Alviar M.D.

## 2019-07-19 ENCOUNTER — Other Ambulatory Visit: Payer: Self-pay

## 2019-07-20 ENCOUNTER — Ambulatory Visit (INDEPENDENT_AMBULATORY_CARE_PROVIDER_SITE_OTHER): Payer: BLUE CROSS/BLUE SHIELD

## 2019-07-20 ENCOUNTER — Encounter: Payer: Self-pay | Admitting: Internal Medicine

## 2019-07-20 ENCOUNTER — Ambulatory Visit (INDEPENDENT_AMBULATORY_CARE_PROVIDER_SITE_OTHER): Payer: BLUE CROSS/BLUE SHIELD | Admitting: Internal Medicine

## 2019-07-20 VITALS — BP 118/80 | HR 83 | Temp 97.4°F | Ht 59.5 in | Wt 124.8 lb

## 2019-07-20 DIAGNOSIS — E042 Nontoxic multinodular goiter: Secondary | ICD-10-CM | POA: Diagnosis not present

## 2019-07-20 DIAGNOSIS — R053 Chronic cough: Secondary | ICD-10-CM

## 2019-07-20 DIAGNOSIS — Z23 Encounter for immunization: Secondary | ICD-10-CM | POA: Diagnosis not present

## 2019-07-20 DIAGNOSIS — R05 Cough: Secondary | ICD-10-CM

## 2019-07-20 DIAGNOSIS — Z0000 Encounter for general adult medical examination without abnormal findings: Secondary | ICD-10-CM | POA: Diagnosis not present

## 2019-07-20 DIAGNOSIS — E049 Nontoxic goiter, unspecified: Secondary | ICD-10-CM

## 2019-07-20 DIAGNOSIS — E785 Hyperlipidemia, unspecified: Secondary | ICD-10-CM

## 2019-07-20 LAB — BASIC METABOLIC PANEL
BUN: 14 mg/dL (ref 6–23)
CO2: 29 mEq/L (ref 19–32)
Calcium: 9.8 mg/dL (ref 8.4–10.5)
Chloride: 100 mEq/L (ref 96–112)
Creatinine, Ser: 0.64 mg/dL (ref 0.40–1.20)
GFR: 96.57 mL/min (ref >60.00)
Glucose, Bld: 106 mg/dL — ABNORMAL HIGH (ref 70–99)
Potassium: 4.2 mEq/L (ref 3.5–5.1)
Sodium: 137 mEq/L (ref 135–145)

## 2019-07-20 LAB — LIPID PANEL
Cholesterol: 271 mg/dL — ABNORMAL HIGH (ref 0–200)
HDL: 52.5 mg/dL (ref >39.00)
NonHDL: 218.95
Total CHOL/HDL Ratio: 5
Triglycerides: 240 mg/dL — ABNORMAL HIGH (ref 0.0–149.0)
VLDL: 48 mg/dL — ABNORMAL HIGH (ref 0.0–40.0)

## 2019-07-20 LAB — TSH: TSH: 1.22 u[IU]/mL (ref 0.35–4.50)

## 2019-07-20 LAB — T3, FREE: T3, Free: 3.6 pg/mL (ref 2.3–4.2)

## 2019-07-20 LAB — T4, FREE: Free T4: 1.1 ng/dL (ref 0.60–1.60)

## 2019-07-20 LAB — LDL CHOLESTEROL, DIRECT: Direct LDL: 177 mg/dL

## 2019-07-20 NOTE — Progress Notes (Signed)
So the chest x ray shows a nodule that may not  be in the lung or not clinically significant  ( I cant see the picture on line today) but to define this ,radiologist is advising   a non contrast chest CT to help define this. We can order this but  may want to wait to get this done until after seeing the ENT in case ENT want s additional imaging in the chest  ( sometimes goiter goes into chest)   Please advise  when you want [place] to order the limited CT scan of the chest.

## 2019-07-20 NOTE — Patient Instructions (Signed)
Have ENT evaluate for  Cough and silent reflux as well as  The.lsi  goiter.  Will notify you  of labs when available.   Attention to  lifestyle intervention healthy eating and exercise .    High Triglycerides Eating Plan Triglycerides are a type of fat in the blood. High levels of triglycerides can increase your risk of heart disease and stroke. If your triglyceride levels are high, choosing the right foods can help lower your triglycerides and keep your heart healthy. Work with your health care provider or a diet and nutrition specialist (dietitian) to develop an eating plan that is right for you. What are tips for following this plan? General guidelines   Lose weight, if you are overweight. For most people, losing 5-10 lbs (2-5 kg) helps lower triglyceride levels. A weight-loss plan may include. ? 30 minutes of exercise at least 5 days a week. ? Reducing the amount of calories, sugar, and fat you eat.  Eat a wide variety of fresh fruits, vegetables, and whole grains. These foods are high in fiber.  Eat foods that contain healthy fats, such as fatty fish, nuts, seeds, and olive oil.  Avoid foods that are high in added sugar, added salt (sodium), saturated fat, and trans fat.  Avoid low-fiber, refined carbohydrates such as white bread, crackers, noodles, and white rice.  Avoid foods with partially hydrogenated oils (trans fats), such as fried foods or stick margarine.  Limit alcohol intake to no more than 1 drink a day for nonpregnant women and 2 drinks a day for men. One drink equals 12 oz of beer, 5 oz of wine, or 1 oz of hard liquor. Your health care provider may recommend that you drink less depending on your overall health. Reading food labels  Check food labels for the amount of saturated fat. Choose foods with no or very little saturated fat.  Check food labels for the amount of trans fat. Choose foods with no trans fat.  Check food labels for the amount of cholesterol.  Choose foods low in cholesterol. Ask your dietitian how much cholesterol you should have each day.  Check food labels for the amount of sodium. Choose foods with less than 140 milligrams (mg) per serving. Shopping  Buy dairy products labeled as nonfat (skim) or low-fat (1%).  Avoid buying processed or prepackaged foods. These are often high in added sugar, sodium, and fat. Cooking  Choose healthy fats when cooking, such as olive oil or canola oil.  Cook foods using lower fat methods, such as baking, broiling, boiling, or grilling.  Make your own sauces, dressings, and marinades when possible, instead of buying them. Store-bought sauces, dressings, and marinades are often high in sodium and sugar. Meal planning  Eat more home-cooked food and less restaurant, buffet, and fast food.  Eat fatty fish at least 2 times each week. Examples of fatty fish include salmon, trout, mackerel, tuna, and herring.  If you eat whole eggs, do not eat more than 3 egg yolks per week. What foods are recommended? The items listed may not be a complete list. Talk with your dietitian about what dietary choices are best for you. Grains Whole wheat or whole grain breads, crackers, cereals, and pasta. Unsweetened oatmeal. Bulgur. Barley. Quinoa. Brown rice. Whole wheat flour tortillas. Vegetables Fresh or frozen vegetables. Low-sodium canned vegetables. Fruits All fresh, canned (in natural juice), or frozen fruits. Meats and other protein foods Skinless chicken or [place]. Ground chicken or [place]. Lean cuts of pork, trimmed  of fat. Fish and seafood, especially salmon, trout, and herring. Egg whites. Dried beans, peas, or lentils. Unsalted nuts or seeds. Unsalted canned beans. Natural peanut or almond butter. Dairy Low-fat dairy products. Skim or low-fat (1%) milk. Reduced fat (2%) and low-sodium cheese. Low-fat ricotta cheese. Low-fat cottage cheese. Plain, low-fat yogurt. Fats and oils Tub margarine without  trans fats. Light or reduced-fat mayonnaise. Light or reduced-fat salad dressings. Avocado. Safflower, olive, sunflower, soybean, and canola oils. What foods are not recommended? The items listed may not be a complete list. Talk with your dietitian about what dietary choices are best for you. Grains White bread. White (regular) pasta. White rice. Cornbread. Bagels. Pastries. Crackers that contain trans fat. Vegetables Creamed or fried vegetables. Vegetables in a cheese sauce. Fruits Sweetened dried fruit. Canned fruit in syrup. Fruit juice. Meats and other protein foods Fatty cuts of meat. Ribs. Chicken wings. Berniece Salines. Sausage. Bologna. Salami. Chitterlings. Fatback. Hot dogs. Bratwurst. Packaged lunch meats. Dairy Whole or reduced-fat (2%) milk. Half-and-half. Cream cheese. Full-fat or sweetened yogurt. Full-fat cheese. Nondairy creamers. Whipped toppings. Processed cheese or cheese spreads. Cheese curds. Beverages Alcohol. Sweetened drinks, such as soda, lemonade, fruit drinks, or punches. Fats and oils Butter. Stick margarine. Lard. Shortening. Ghee. Bacon fat. Tropical oils, such as coconut, palm kernel, or palm oils. Sweets and desserts Corn syrup. Sugars. Honey. Molasses. Candy. Jam and jelly. Syrup. Sweetened cereals. Cookies. Pies. Cakes. Donuts. Muffins. Ice cream. Condiments Store-bought sauces, dressings, and marinades that are high in sugar, such as ketchup and barbecue sauce. Summary  High levels of triglycerides can increase the risk of heart disease and stroke. Choosing the right foods can help lower your triglycerides.  Eat plenty of fresh fruits, vegetables, and whole grains. Choose low-fat dairy and lean meats. Eat fatty fish at least twice a week.  Avoid processed and prepackaged foods with added sugar, sodium, saturated fat, and trans fat.  If you need suggestions or have questions about what types of food are good for you, talk with your health care provider or a  dietitian. This information is not intended to replace advice given to you by your health care provider. Make sure you discuss any questions you have with your health care provider. Document Revised: 03/07/2017 Document Reviewed: 05/28/2016 Elsevier Patient Education  2020 [employer].

## 2019-07-21 DIAGNOSIS — E049 Nontoxic goiter, unspecified: Secondary | ICD-10-CM | POA: Diagnosis not present

## 2019-07-21 LAB — THYROID ANTIBODIES
Thyroglobulin Ab: <1 IU/mL (ref <=1)
Thyroperoxidase Ab SerPl-aCnc: 7 IU/mL (ref <9)

## 2019-07-21 NOTE — Progress Notes (Signed)
Please order non contrast chest ct to check out nodule as per radiologist. As far as the acid reflux medication   ENT  should give advice on how long but may be for a month or so sometimes

## 2019-07-26 ENCOUNTER — Other Ambulatory Visit: Payer: Self-pay

## 2019-07-26 DIAGNOSIS — R911 Solitary pulmonary nodule: Secondary | ICD-10-CM

## 2019-07-29 NOTE — Progress Notes (Signed)
Cholesterol and jumped up from last time  need to intensify life style to get thi down  you can consider medication  but  10 year risk at this time is borderline low  we should recheck lipids at least yearly  The 10-year ASCVD risk score Mikey Bussing DC Brooke Bonito., et al., 2013) is: 8.6%   Values used to calculate the score:     Age: 54 years     Sex: Female     Is Non-Hispanic African American: No     Diabetic: Yes     Tobacco smoker: No     Systolic Blood Pressure: 123XX123 mmHg     Is BP treated: No     HDL Cholesterol: 52.5 mg/dL     Total Cholesterol: 271 mg/dL

## 2019-08-02 ENCOUNTER — Other Ambulatory Visit: Payer: Self-pay

## 2019-08-02 ENCOUNTER — Ambulatory Visit: Payer: BLUE CROSS/BLUE SHIELD | Attending: Internal Medicine

## 2019-08-02 DIAGNOSIS — Z23 Encounter for immunization: Secondary | ICD-10-CM

## 2019-08-02 DIAGNOSIS — E785 Hyperlipidemia, unspecified: Secondary | ICD-10-CM

## 2019-08-02 DIAGNOSIS — E7800 Pure hypercholesterolemia, unspecified: Secondary | ICD-10-CM

## 2019-08-02 NOTE — Progress Notes (Signed)
   Covid-[facility] Vaccination Clinic  Name:  Melissa Brown    MRN: [id number] DOB: 1965/06/10  08/02/2019  [name] was observed post Covid-19 immunization for 15 minutes without incident. She was provided with Vaccine Information Sheet and instruction to access the V-Safe system.   [name] was instructed to call 911 with any severe reactions post vaccine: Marland Kitchen Difficulty breathing  . Swelling of face and throat  . A fast heartbeat  . A bad rash all over body  . Dizziness and weakness   Immunizations Administered    Name Date Dose VIS Date Route   Pfizer COVID-19 Vaccine 08/02/2019  2:45 PM 0.3 mL 06/02/2018 Intramuscular   Manufacturer: [place]   Lot: [id number]   [place]: [id number]

## 2019-08-02 NOTE — Telephone Encounter (Signed)
Sounds like a good plan.  megan please  Order a future LIPID panel fasting in 4-6 months dx HLD ( Insurance should cover for that dx )

## 2019-08-10 ENCOUNTER — Ambulatory Visit
Admission: RE | Admit: 2019-08-10 | Discharge: 2019-08-10 | Disposition: A | Discharge status: Home / Self Care | Payer: BLUE CROSS/BLUE SHIELD | Source: Ambulatory Visit | Attending: Internal Medicine | Admitting: Internal Medicine

## 2019-08-10 ENCOUNTER — Other Ambulatory Visit: Payer: Self-pay

## 2019-08-10 DIAGNOSIS — R911 Solitary pulmonary nodule: Secondary | ICD-10-CM

## 2019-08-11 ENCOUNTER — Other Ambulatory Visit: Payer: Self-pay

## 2019-08-11 DIAGNOSIS — R911 Solitary pulmonary nodule: Secondary | ICD-10-CM

## 2019-08-11 NOTE — Progress Notes (Signed)
Good news  seems like a benign scaring  process but radiaology advised to do fu non contrast chest ct in 6 months to be sure no changes  please order this if agreeable.  Other findings  incidental  no calcium build up in main arteries .

## 2019-09-30 NOTE — Telephone Encounter (Signed)
Certainly can try omega 3 supplementation   To help lower triglycerides but also consider eating foods high in omega 3  Such as soe fish etc.  And increase exercise.  Diet that helps blood sugar reduce also helps triglycerides .   The only evidence that omega 3 helps heart health outcome is from studies from prescription omega 3 vascepa meds  ( but quite  expensive)

## 2020-01-19 ENCOUNTER — Telehealth: Payer: Self-pay

## 2020-01-20 NOTE — Telephone Encounter (Signed)
Please call and see if the MRI can be completed earlier than 11/11 due to insurance coverage lapse.

## 2020-01-20 NOTE — Telephone Encounter (Signed)
Yes  This will be a ct of lung for pulmonary nodule evaluation

## 2020-02-08 ENCOUNTER — Other Ambulatory Visit: Payer: BLUE CROSS/BLUE SHIELD

## 2020-02-17 DIAGNOSIS — R922 Inconclusive mammogram: Secondary | ICD-10-CM | POA: Diagnosis not present

## 2020-02-17 NOTE — Telephone Encounter (Signed)
PT Authorization is valid through -Order ID: [id number] Request Status:  [place]: [place] [employer]/[place] #: [id number] Valid Dates: 02/14/2020 - 03/14/2020 Scheduled Date of Service: 02/14/2020 pt aware she is not scheduled until 11/16

## 2020-02-22 ENCOUNTER — Other Ambulatory Visit: Payer: Self-pay

## 2020-02-22 ENCOUNTER — Ambulatory Visit
Admission: RE | Admit: 2020-02-22 | Discharge: 2020-02-22 | Disposition: A | Discharge status: Home / Self Care | Payer: BLUE CROSS/BLUE SHIELD | Source: Ambulatory Visit | Attending: Internal Medicine | Admitting: Internal Medicine

## 2020-02-22 DIAGNOSIS — M419 Scoliosis, unspecified: Secondary | ICD-10-CM | POA: Diagnosis not present

## 2020-02-22 DIAGNOSIS — R911 Solitary pulmonary nodule: Secondary | ICD-10-CM

## 2020-02-22 DIAGNOSIS — Z853 Personal history of malignant neoplasm of breast: Secondary | ICD-10-CM | POA: Diagnosis not present

## 2020-02-22 DIAGNOSIS — J984 Other disorders of lung: Secondary | ICD-10-CM | POA: Diagnosis not present

## 2020-02-25 NOTE — Progress Notes (Signed)
So basically the lung nodule of concern is benign and no concerns  there are 2 incidental findings that need follow-up  thyroid nodule and a small area in the pancreas.Radiologic recommendations are follow-up in a year with abdominal CT with and without contrast to be sure all okayAlso thyroid ultrasound to get a better delineation of the thyroid nodule.If you want to discuss this we can make a virtual visit.  Neither of these findings are alarming but need follow-upPlease order thyroid ultrasound for right thyroid nodule.

## 2020-02-28 ENCOUNTER — Other Ambulatory Visit: Payer: Self-pay

## 2020-02-28 DIAGNOSIS — Z0000 Encounter for general adult medical examination without abnormal findings: Secondary | ICD-10-CM

## 2020-02-28 DIAGNOSIS — R109 Unspecified abdominal pain: Secondary | ICD-10-CM

## 2020-02-28 DIAGNOSIS — R911 Solitary pulmonary nodule: Secondary | ICD-10-CM

## 2020-03-01 NOTE — Telephone Encounter (Signed)
Thanks for the update  Order  Is in the system  For abd ct w and wo  Should be done in nov 2022 or therdabouts

## 2020-07-26 DIAGNOSIS — E042 Nontoxic multinodular goiter: Secondary | ICD-10-CM | POA: Diagnosis not present

## 2020-07-26 DIAGNOSIS — E049 Nontoxic goiter, unspecified: Secondary | ICD-10-CM | POA: Diagnosis not present

## 2020-07-26 DIAGNOSIS — E041 Nontoxic single thyroid nodule: Secondary | ICD-10-CM | POA: Diagnosis not present

## 2020-08-01 DIAGNOSIS — Z01411 Encounter for gynecological examination (general) (routine) with abnormal findings: Secondary | ICD-10-CM | POA: Diagnosis not present

## 2020-08-01 DIAGNOSIS — E049 Nontoxic goiter, unspecified: Secondary | ICD-10-CM | POA: Diagnosis not present

## 2020-08-01 DIAGNOSIS — Z780 Asymptomatic menopausal state: Secondary | ICD-10-CM | POA: Diagnosis not present

## 2020-08-01 DIAGNOSIS — D259 Leiomyoma of uterus, unspecified: Secondary | ICD-10-CM | POA: Diagnosis not present

## 2020-08-01 DIAGNOSIS — E041 Nontoxic single thyroid nodule: Secondary | ICD-10-CM | POA: Diagnosis not present

## 2020-08-01 DIAGNOSIS — Z1151 Encounter for screening for human papillomavirus (HPV): Secondary | ICD-10-CM | POA: Diagnosis not present

## 2020-08-01 DIAGNOSIS — R21 Rash and other nonspecific skin eruption: Secondary | ICD-10-CM | POA: Diagnosis not present

## 2020-08-01 DIAGNOSIS — N390 Urinary tract infection, site not specified: Secondary | ICD-10-CM | POA: Diagnosis not present

## 2020-08-04 DIAGNOSIS — E049 Nontoxic goiter, unspecified: Secondary | ICD-10-CM | POA: Diagnosis not present

## 2020-08-04 DIAGNOSIS — E041 Nontoxic single thyroid nodule: Secondary | ICD-10-CM | POA: Diagnosis not present

## 2020-12-19 DIAGNOSIS — E7800 Pure hypercholesterolemia, unspecified: Secondary | ICD-10-CM

## 2020-12-19 DIAGNOSIS — Z79899 Other long term (current) drug therapy: Secondary | ICD-10-CM

## 2020-12-19 DIAGNOSIS — E049 Nontoxic goiter, unspecified: Secondary | ICD-10-CM

## 2020-12-19 DIAGNOSIS — Z0000 Encounter for general adult medical examination without abnormal findings: Secondary | ICD-10-CM

## 2020-12-19 DIAGNOSIS — E785 Hyperlipidemia, unspecified: Secondary | ICD-10-CM

## 2020-12-19 DIAGNOSIS — R7301 Impaired fasting glucose: Secondary | ICD-10-CM

## 2020-12-19 DIAGNOSIS — Z853 Personal history of malignant neoplasm of breast: Secondary | ICD-10-CM

## 2020-12-19 NOTE — Telephone Encounter (Signed)
Future lab orders have been placed  Make a lab appointment before next physical to review results at the visit

## 2021-01-19 ENCOUNTER — Other Ambulatory Visit (INDEPENDENT_AMBULATORY_CARE_PROVIDER_SITE_OTHER): Payer: BLUE CROSS/BLUE SHIELD

## 2021-01-19 ENCOUNTER — Other Ambulatory Visit: Payer: Self-pay

## 2021-01-19 DIAGNOSIS — R7301 Impaired fasting glucose: Secondary | ICD-10-CM

## 2021-01-19 DIAGNOSIS — Z0000 Encounter for general adult medical examination without abnormal findings: Secondary | ICD-10-CM | POA: Diagnosis not present

## 2021-01-19 DIAGNOSIS — E7800 Pure hypercholesterolemia, unspecified: Secondary | ICD-10-CM | POA: Diagnosis not present

## 2021-01-19 DIAGNOSIS — E049 Nontoxic goiter, unspecified: Secondary | ICD-10-CM

## 2021-01-19 DIAGNOSIS — E785 Hyperlipidemia, unspecified: Secondary | ICD-10-CM | POA: Diagnosis not present

## 2021-01-19 DIAGNOSIS — Z853 Personal history of malignant neoplasm of breast: Secondary | ICD-10-CM

## 2021-01-19 DIAGNOSIS — Z79899 Other long term (current) drug therapy: Secondary | ICD-10-CM

## 2021-01-19 LAB — CBC WITH DIFFERENTIAL/PLATELET
Basophils Absolute: 0 10*3/uL (ref 0.0–0.1)
Basophils Relative: 0.7 % (ref 0.0–3.0)
Eosinophils Absolute: 0.2 10*3/uL (ref 0.0–0.7)
Eosinophils Relative: 4.1 % (ref 0.0–5.0)
HCT: 40.4 % (ref 36.0–46.0)
Hemoglobin: 13.8 g/dL (ref 12.0–15.0)
Lymphocytes Relative: 31.5 % (ref 12.0–46.0)
Lymphs Abs: 1.4 10*3/uL (ref 0.7–4.0)
MCHC: 34.1 g/dL (ref 30.0–36.0)
MCV: 92 fl (ref 78.0–100.0)
Monocytes Absolute: 0.3 10*3/uL (ref 0.1–1.0)
Monocytes Relative: 5.9 % (ref 3.0–12.0)
Neutro Abs: 2.6 10*3/uL (ref 1.4–7.7)
Neutrophils Relative %: 57.8 % (ref 43.0–77.0)
Platelets: 262 10*3/uL (ref 150.0–400.0)
RBC: 4.39 Mil/uL (ref 3.87–5.11)
RDW: 12.7 % (ref 11.5–15.5)
WBC: 4.5 10*3/uL (ref 4.0–10.5)

## 2021-01-19 LAB — BASIC METABOLIC PANEL
BUN: 14 mg/dL (ref 6–23)
CO2: 26 mEq/L (ref 19–32)
Calcium: 9.2 mg/dL (ref 8.4–10.5)
Chloride: 102 mEq/L (ref 96–112)
Creatinine, Ser: 0.68 mg/dL (ref 0.40–1.20)
GFR: 97.74 mL/min (ref >60.00)
Glucose, Bld: 111 mg/dL — ABNORMAL HIGH (ref 70–99)
Potassium: 4 mEq/L (ref 3.5–5.1)
Sodium: 136 mEq/L (ref 135–145)

## 2021-01-19 LAB — LIPID PANEL
Cholesterol: 246 mg/dL — ABNORMAL HIGH (ref 0–200)
HDL: 49.5 mg/dL (ref >39.00)
LDL Cholesterol: 165 mg/dL — ABNORMAL HIGH (ref 0–99)
NonHDL: 196.83
Total CHOL/HDL Ratio: 5
Triglycerides: 160 mg/dL — ABNORMAL HIGH (ref 0.0–149.0)
VLDL: 32 mg/dL (ref 0.0–40.0)

## 2021-01-19 LAB — HEPATIC FUNCTION PANEL
ALT: 20 U/L (ref 0–35)
AST: 20 U/L (ref 0–37)
Albumin: 4.6 g/dL (ref 3.5–5.2)
Alkaline Phosphatase: 66 U/L (ref 39–117)
Bilirubin, Direct: 0.1 mg/dL (ref 0.0–0.3)
Total Bilirubin: 0.5 mg/dL (ref 0.2–1.2)
Total Protein: 8 g/dL (ref 6.0–8.3)

## 2021-01-19 LAB — TSH: TSH: 1.45 u[IU]/mL (ref 0.35–5.50)

## 2021-01-19 LAB — HEMOGLOBIN A1C: Hgb A1c MFr Bld: 6.1 % (ref 4.6–6.5)

## 2021-01-24 ENCOUNTER — Encounter: Payer: BLUE CROSS/BLUE SHIELD | Admitting: Internal Medicine

## 2021-01-26 NOTE — Progress Notes (Signed)
Melissa Brown  review at upcoming  visit  blood sugar  stable   pre diabetes  cholesterol should be better

## 2021-02-13 ENCOUNTER — Other Ambulatory Visit: Payer: Self-pay

## 2021-02-13 NOTE — Progress Notes (Signed)
Chief Complaint  Patient presents with   Annual Exam    HPI: Patient  Melissa Brown  55 y.o. comes in today for Preventive Health Care visit   Check lump in nostril  tender . Not   tuffy. .  Present for a few weeks tender to touch otherwise no other symptoms. Discussed cholesterol levels negative family history of vascular disease Has changed her diet to different kinds of rice healthier Ask about blood pressure no history of hypertension although parents had hypertension.  And also diabetes runs in the family. Husband has melanoma and is on immunosuppressive therapy asks about immunizations and COVID boosters.   Health Maintenance  Topic Date Due   Pneumococcal Vaccine 21-42 Years old (1 - PCV) Never done   FOOT EXAM  Never done   HIV Screening  Never done   URINE MICROALBUMIN  01/15/2014   Zoster Vaccines- Shingrix (2 of 2) 01/10/2018   COVID-19 Vaccine (3 - Pfizer risk series) 08/30/2019   MAMMOGRAM  04/08/2021 (Originally 01/11/2020)   OPHTHALMOLOGY EXAM  04/08/2021 (Originally 07/20/2018)   HEMOGLOBIN A1C  07/20/2021   PAP SMEAR-Modifier  06/22/2022   COLONOSCOPY (Pts 45-51yrs Insurance coverage will need to be confirmed)  07/20/2026   TETANUS/TDAP  07/19/2029   INFLUENZA VACCINE  Completed   Hepatitis C Screening  Completed   HPV VACCINES  Aged Out   Health Maintenance Review LIFESTYLE:  Exercise:   4 days per week .  20 - 30 minutes  Tobacco/ETS:n Alcohol: N Sugar beverages: Sleep: 7 hours  Drug use: no HH of   1 dog   3  Work: mostly at home  one in office .  18 yours  Had lfy vaccine   Husband   melanoma ROS:    REST of 12 system review negative except as per HPI   Past Medical History:  Diagnosis Date   Allergic    to grass and moldsby skin testing    Angioedema of lips 12/07/2010   Hx of seem in August 12  remote hx of hives as a child.     Breast cancer ([place]) 2005   left dcis   Breast cancer ([place])    Diabetes, gestational    GESTATIONAL DIABETES  02/09/2007   Qualifier: History of  By: Regis Bill MD, Standley Brooking    MVP (mitral valve prolapse)    ANTERIOR LEAFLET, MILD MR   Palpitations    tachy, supressed with atenolol   Scoliosis    Thyroid disease    HYPERTHYROIDISM    Past Surgical History:  Procedure Laterality Date   BREAST LUMPECTOMY     radiation DCIS- reconstruction left   CESAREAN SECTION      Family History  Problem Relation Age of Onset   Diabetes Mother    Thyroid disease Mother    Hypertension Mother    Diabetes Father    Heart disease Father    Diabetes Other     Social History   Socioeconomic History   Marital status: Married    Spouse name: Not on file   Number of children: Not on file   Years of education: Not on file   Highest education level: Not on file  Occupational History   Not on file  Tobacco Use   Smoking status: Never   Smokeless tobacco: Never  Substance and Sexual Activity   Alcohol use: Yes    Alcohol/week: 1.0 standard drink    Types: 1 Standard drinks or equivalent per week  Drug use: No   Sexual activity: Not on file  Other Topics Concern   Not on file  Social History Narrative   Born [place] in [place] 25 yes    hhof 2.5 son at college , with dog   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     cetirizine (ZYRTEC) 10 MG tablet Allertec ([employer] Brand) 10mg  daily     Cholecalciferol (VITAMIN D-3 PO) Take by mouth.     CRANBERRY EXTRACT PO Take by mouth daily at 2 PM.     Misc Natural Products (OSTEO  BI-FLEX/5-LOXIN ADVANCED PO) Take by mouth in the morning and at bedtime.     Multiple Vitamins-Calcium (ONE-A-DAY WOMENS PO) Take by mouth daily.       ferrous sulfate 325 (65 FE) MG tablet Take 325 mg by mouth daily with breakfast. During period     fluocinonide-emollient (LIDEX-E) 0.05 % cream Apply 1 application topically 2 (two) times daily. Not on face 30 g 3   No facility-administered medications prior to visit.     EXAM:  BP (!) 150/80 (BP Location: Right Arm, Patient Position: Sitting, Cuff Size: Normal)   Pulse (!) 104   Temp 97.8 F (36.6 C) (Oral)   Ht 5' (1.524 m)   Wt 120 lb 9.6 oz (54.7 kg)   SpO2 99%   BMI 23.55 kg/m   Body mass index is 23.55 kg/m. Wt Readings from Last 3 Encounters:  02/14/21 120 lb 9.6 oz (54.7 kg)  07/20/19 124 lb 12.8 oz (56.6 kg)  04/21/18 127 lb 6.4 oz (57.8 kg)    Physical Exam: Vital signs reviewed UXL:KGMW is a well-developed well-nourished alert cooperative    who appearsr stated age in no acute distress.  HEENT: normocephalic atraumatic , Eyes: PERRL EOM's full, conjunctiva clear, tenderness., Ears: no deformity EAC's clear TMs with normal landmarks. Mouth: masked nose no discharge right anterior superior area looks like a filiform flesh-colored lesion or bump no discharge or blood NECK: supple without masses, thyromegaly or bruits. CHEST/PULM:  Clear to auscultation and percussion breath sounds equal no wheeze , rales or rhonchi. No chest wall deformities or tenderness. Breast: No discharge or lump noted well-healed scar left CV: PMI is nondisplaced, S1 S2 no gallops, murmurs, rubs. Peripheral pulses are full without delay.No JVD .  ABDOMEN: Bowel sounds normal nontender  No guard or rebound, no hepato splenomegal no CVA tenderness.  No hernia. Extremtities:  No clubbing cyanosis or edema, no acute joint swelling or redness no focal atrophy NEURO:  Oriented x3, cranial nerves 3-12 appear to be intact, no obvious focal weakness,gait  within normal limits no abnormal reflexes or asymmetrical SKIN: No acute rashes normal turgor, color, no bruising or petechiae. PSYCH: Oriented, good eye contact, no obvious depression anxiety, cognition and judgment appear normal. LN: no cervical axillary inguinal adenopathy  Lab Results  Component Value Date   WBC 4.5 01/19/2021   HGB 13.8 01/19/2021   HCT  40.4 01/19/2021   PLT 262.0 01/19/2021   GLUCOSE 111 (H) 01/19/2021   CHOL 246 (H) 01/19/2021   TRIG 160.0 (H) 01/19/2021   HDL 49.50 01/19/2021   LDLDIRECT 177.0 07/20/2019   LDLCALC 165 (H) 01/19/2021   ALT 20 01/19/2021   AST 20 01/19/2021   NA 136 01/19/2021   K 4.0 01/19/2021   CL 102 01/19/2021   CREATININE 0.68 01/19/2021   BUN 14 01/19/2021   CO2 26 01/19/2021   TSH 1.45 01/19/2021   HGBA1C 6.1 01/19/2021   MICROALBUR 0.5 01/15/2013    BP Readings from Last 3 Encounters:  02/14/21 (!) 150/80  07/20/19 118/80  04/21/18 122/74   The 10-year ASCVD risk score (Arnett DK, et al., 2019) is: 7.1%   Values used to calculate the score:     Age: 41 years     Sex: Female     Is Non-Hispanic African American: No     Diabetic: Yes     Tobacco smoker: No     Systolic Blood Pressure: 546 mmHg     Is BP treated: No     HDL Cholesterol: 49.5 mg/dL     Total Cholesterol: 246 mg/dL  Lab results reviewed with patient   ASSESSMENT AND PLAN:  Discussed the following assessment and plan:    ICD-10-CM   1. Routine general medical examination at a health care facility  Z00.00     2. Hyperlipidemia, unspecified hyperlipidemia type  E78.5 CT CARDIAC SCORING (SELF PAY ONLY)    3. Nose abnormality  Q30.9     4. Elevated BP without diagnosis of hypertension  R03.0     5. Fasting hyperglycemia  R73.01    a1c is 6.1    6. Cardiovascular risk factor  Z91.89 CT CARDIAC SCORING (SELF PAY ONLY)    Discussed further risk assessment about intervention besides her lifestyle and she agrees to coronary artery calcium scan risk  assessment self-pay $99.  She will continue lifestyle intervention Advise she get a arm blood pressure monitor and get some readings to assess status. She can send in readings through MyChart or call and make decisions that way. Consider adding medications if not controlled. Try topical antibiotic for 1 to 2 weeks and if the bump area on the right nostril persist contact us and we will arrange a referral to ear nose and throat for evaluation. She is scheduled for  yearly fu ct abd also in system didn't discuss today   Return for after  results of coronary artery scanand bp readings  virtual or in person OK .  Patient Care Team: Beya Tipps, Standley Brooking, MD as PCP - Carrolyn Meiers, MD as Referring Physician (Obstetrics and Gynecology) Lindon Romp, MD as Consulting Physician (Internal Medicine) Patient Instructions  Good to see you today. In regard to the bump inside the right nostril use over-the-counter Polysporin antibiotic with a Q-tip twice a day for 7 to 10 days and see if it subsides. If it is persistent or progressive please contact us and we will refer to ear nose and throat specialist to look at your nose.  You will be notified about an appointment for coronary artery calcium scan i.e. a mini CT. Depending on results we could consider adding a statin or cholesterol medicine to reduce risk of future vascular events.  You can choose to have the second Shingrix by making an nurse appointment at any time.  Blood pressure somewhat elevated today below 140/90 is advised best  goal is 130/80 and below average  Please bring your blood pressure cuff to next appointment( any clinic)  Take blood pressure readings twice a day for 5-7 days and record .     Take 2 -3 readings at each sitting .   Can send in readings  by My Chart.     Before checking your blood pressure make sure: You are seated and quite for 5 min before checking Feet are flat on the floor Siting in chair  with your back supported straight up and down Arm resting on table or arm of chair at heart level Bladder is empty You have NOT had caffeine or tobacco within the last 30 min  [email or web address]   Make sure you get your mammogram  We can do a follow up virtual visit  after getting results of the coronay scan.   Standley Brooking. Melissa Brown M.D.

## 2021-02-14 ENCOUNTER — Ambulatory Visit (INDEPENDENT_AMBULATORY_CARE_PROVIDER_SITE_OTHER): Payer: BLUE CROSS/BLUE SHIELD | Admitting: Internal Medicine

## 2021-02-14 ENCOUNTER — Encounter: Payer: Self-pay | Admitting: Internal Medicine

## 2021-02-14 VITALS — BP 150/80 | HR 104 | Temp 97.8°F | Ht 60.0 in | Wt 120.6 lb

## 2021-02-14 DIAGNOSIS — Q309 Congenital malformation of nose, unspecified: Secondary | ICD-10-CM | POA: Diagnosis not present

## 2021-02-14 DIAGNOSIS — E785 Hyperlipidemia, unspecified: Secondary | ICD-10-CM

## 2021-02-14 DIAGNOSIS — Z0000 Encounter for general adult medical examination without abnormal findings: Secondary | ICD-10-CM | POA: Diagnosis not present

## 2021-02-14 DIAGNOSIS — R030 Elevated blood-pressure reading, without diagnosis of hypertension: Secondary | ICD-10-CM

## 2021-02-14 DIAGNOSIS — R7301 Impaired fasting glucose: Secondary | ICD-10-CM | POA: Diagnosis not present

## 2021-02-14 DIAGNOSIS — Z9189 Other specified personal risk factors, not elsewhere classified: Secondary | ICD-10-CM

## 2021-02-14 NOTE — Patient Instructions (Addendum)
Good to see you today. In regard to the bump inside the right nostril use over-the-counter Polysporin antibiotic with a Q-tip twice a day for 7 to 10 days and see if it subsides. If it is persistent or progressive please contact us and we will refer to ear nose and throat specialist to look at your nose.  You will be notified about an appointment for coronary artery calcium scan i.e. a mini CT. Depending on results we could consider adding a statin or cholesterol medicine to reduce risk of future vascular events.  You can choose to have the second Shingrix by making an nurse appointment at any time.  Blood pressure somewhat elevated today below 140/90 is advised best goal is 130/80 and below average  Please bring your blood pressure cuff to next appointment( any clinic)  Take blood pressure readings twice a day for 5-7 days and record .     Take 2 -3 readings at each sitting .   Can send in readings  by My Chart.     Before checking your blood pressure make sure: You are seated and quite for 5 min before checking Feet are flat on the floor Siting in chair with your back supported straight up and down Arm resting on table or arm of chair at heart level Bladder is empty You have NOT had caffeine or tobacco within the last 30 min  [email or web address]   Make sure you get your mammogram  We can do a follow up virtual visit  after getting results of the coronay scan.

## 2021-02-15 ENCOUNTER — Telehealth: Payer: Self-pay | Admitting: Internal Medicine

## 2021-02-15 DIAGNOSIS — Z01818 Encounter for other preprocedural examination: Secondary | ICD-10-CM

## 2021-02-15 NOTE — Telephone Encounter (Addendum)
Taryn with [facility] CT dept is calling and pt needs bmp drawn. Pt is have ct abd scan on 02-22-2021. Pt did have labs on 01-19-2021  bmp only good for 30 days

## 2021-02-15 NOTE — Telephone Encounter (Signed)
Lab orders have been placed

## 2021-02-15 NOTE — Telephone Encounter (Signed)
I think that is silly   33 days and 30 days are not clinically different .  Nevertheless  please order the  bmp for pre procedure  chemistry.

## 2021-02-19 DIAGNOSIS — R928 Other abnormal and inconclusive findings on diagnostic imaging of breast: Secondary | ICD-10-CM | POA: Diagnosis not present

## 2021-02-19 DIAGNOSIS — Z853 Personal history of malignant neoplasm of breast: Secondary | ICD-10-CM | POA: Diagnosis not present

## 2021-02-22 ENCOUNTER — Inpatient Hospital Stay: Admission: RE | Admit: 2021-02-22 | Payer: BLUE CROSS/BLUE SHIELD | Source: Ambulatory Visit

## 2021-03-19 ENCOUNTER — Ambulatory Visit (INDEPENDENT_AMBULATORY_CARE_PROVIDER_SITE_OTHER)
Admission: RE | Admit: 2021-03-19 | Discharge: 2021-03-19 | Disposition: A | Discharge status: Home / Self Care | Payer: Self-pay | Source: Ambulatory Visit | Attending: Internal Medicine | Admitting: Internal Medicine

## 2021-03-19 ENCOUNTER — Other Ambulatory Visit: Payer: Self-pay

## 2021-03-19 DIAGNOSIS — E785 Hyperlipidemia, unspecified: Secondary | ICD-10-CM

## 2021-03-19 DIAGNOSIS — Z9189 Other specified personal risk factors, not elsewhere classified: Secondary | ICD-10-CM

## 2021-03-20 NOTE — Progress Notes (Signed)
Good news   CAC score is zero   giving a much  lower 10 year event risk .

## 2021-04-11 ENCOUNTER — Ambulatory Visit: Payer: BLUE CROSS/BLUE SHIELD | Admitting: Internal Medicine

## 2021-04-11 ENCOUNTER — Other Ambulatory Visit: Payer: Self-pay

## 2021-04-11 ENCOUNTER — Ambulatory Visit (INDEPENDENT_AMBULATORY_CARE_PROVIDER_SITE_OTHER): Payer: BLUE CROSS/BLUE SHIELD

## 2021-04-11 ENCOUNTER — Encounter: Payer: Self-pay | Admitting: Internal Medicine

## 2021-04-11 VITALS — BP 118/76 | HR 89 | Temp 98.4°F | Ht 60.0 in | Wt 120.0 lb

## 2021-04-11 DIAGNOSIS — M25461 Effusion, right knee: Secondary | ICD-10-CM

## 2021-04-11 DIAGNOSIS — Z9181 History of falling: Secondary | ICD-10-CM

## 2021-04-11 DIAGNOSIS — M1711 Unilateral primary osteoarthritis, right knee: Secondary | ICD-10-CM | POA: Diagnosis not present

## 2021-04-11 DIAGNOSIS — S8991XA Unspecified injury of right lower leg, initial encounter: Secondary | ICD-10-CM

## 2021-04-11 NOTE — Patient Instructions (Signed)
Will do referral to  sports medicine .   Try adding topical voltaren ( diclofenac ) gel 1%  3-4 x per day ( otc)  X ray today

## 2021-04-11 NOTE — Progress Notes (Signed)
Chief Complaint  Patient presents with   Joint Swelling    Right knee swelling due to fall. Patient has been using cold therapy.     HPI: Melissa Brown 57 y.o. come in for new problem   Around dec 15  felt  r anterior knee was sore without injury;  Went to [place]  around 18 swollen   more difficult to bend .Marland Kitchen.fell both knees when outside directly on both knees hurt but was able to walk, used cold therapy for 4 days  swelling has decreased but still feels it.  On flexion and tender more anteriorly laterally.  Knee does not feel unstable.  Ask advice evaluation ROS: See pertinent positives and negatives per HPI.  Past Medical History:  Diagnosis Date   Allergic    to grass and moldsby skin testing    Angioedema of lips 12/07/2010   Hx of seem in August 12  remote hx of hives as a child.     Breast cancer ([place]) 2005   left dcis   Breast cancer ([place])    Diabetes, gestational    GESTATIONAL DIABETES 02/09/2007   Qualifier: History of  By: Regis Bill MD, Standley Brooking    MVP (mitral valve prolapse)    ANTERIOR LEAFLET, MILD MR   Palpitations    tachy, supressed with atenolol   Scoliosis    Thyroid disease    HYPERTHYROIDISM    Family History  Problem Relation Age of Onset   Diabetes Mother    Thyroid disease Mother    Hypertension Mother    Diabetes Father    Heart disease Father    Diabetes Other     Social History   Socioeconomic History   Marital status: Married    Spouse name: Not on file   Number of children: Not on file   Years of education: Not on file   Highest education level: Not on file  Occupational History   Not on file  Tobacco Use   Smoking status: Never   Smokeless tobacco: Never  Substance and Sexual Activity   Alcohol use: Yes    Alcohol/week: 1.0 standard drink    Types: 1 Standard drinks or equivalent per week   Drug use: No   Sexual activity: Not on file  Other Topics Concern   Not on file  Social History Narrative   Born [place] in [place] 25 yes    hhof  2.5 son at college , with dog   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     cetirizine (ZYRTEC) 10 MG tablet Allertec ([employer] Brand) 10mg  daily     Cholecalciferol (VITAMIN D-3 PO) Take by mouth.     CRANBERRY EXTRACT PO Take by mouth daily at 2 PM.     Misc Natural Products (OSTEO BI-FLEX/5-LOXIN ADVANCED PO) Take by mouth in the morning and at bedtime.     Multiple Vitamins-Calcium (ONE-A-DAY WOMENS PO) Take by mouth daily.  No facility-administered medications prior to visit.     EXAM:  BP 118/76 (BP Location: Right Arm, Patient Position: Sitting, Cuff Size: Small)    Pulse 89    Temp 98.4 F (36.9 C) (Oral)    Ht 5' (1.524 m)    Wt 120 lb (54.4 kg)    SpO2 97%    BMI 23.44 kg/m   Body mass index is 23.44 kg/m.  GENERAL: vitals reviewed and listed above, alert, oriented, appears well hydrated and in no acute distress HEENT: atraumatic, conjunctiva  clear, no obvious abnormalities on inspection of external nose and ears OP : Masked MS: moves all extremities right knee without warmth or redness but +1 to +2 swelling.  Ballotable patella no deformity points to tenderness infrapatellar and lateral but also posterior range of motion limited flexion.  Negative drawer  no  medial joint line tenderness no bruising limp right knee left knee normal landmarks healing abrasion anterior. PSYCH: pleasant and cooperative, no obvious depression or anxiety  BP Readings from Last 3 Encounters:  04/11/21 118/76   02/14/21 (!) 150/80  07/20/19 118/80    ASSESSMENT AND PLAN:  Discussed the following assessment and plan:  Swelling of joint of right knee - Plan: DG Knee Complete 4 Views Right, Ambulatory referral to Sports Medicine  Injury of right knee, initial encounter - Plan: DG Knee Complete 4 Views Right, Ambulatory referral to Sports Medicine  History of fall - Plan: DG Knee Complete 4 Views Right, Ambulatory referral to Sports Medicine Some knee symptoms predated the fall and pain is described is mostly anterior but would not rule out internal derangement. No history of arthritis although that is possible. Plain x-ray today referral to sports medicine she prefers Dr. Laurance Flatten [facility] place referral. Consider follow-up rehab depending.  She is now over 2 weeks out from onset of symptoms. And fall.  -Patient advised to return or notify health care team  if  new concerns arise.  Patient Instructions  Will do referral to  sports medicine .   Try adding topical voltaren ( diclofenac ) gel 1%  3-4 x per day ( otc)  X ray today    Standley Brooking. Melissa Brown M.D.

## 2021-04-12 NOTE — Progress Notes (Signed)
No acute finding   mild arthritis changes

## 2021-04-17 ENCOUNTER — Ambulatory Visit: Payer: BLUE CROSS/BLUE SHIELD | Admitting: Internal Medicine

## 2021-04-24 DIAGNOSIS — M1711 Unilateral primary osteoarthritis, right knee: Secondary | ICD-10-CM | POA: Diagnosis not present

## 2021-04-24 DIAGNOSIS — M25561 Pain in right knee: Secondary | ICD-10-CM | POA: Diagnosis not present

## 2021-04-24 DIAGNOSIS — G8929 Other chronic pain: Secondary | ICD-10-CM | POA: Diagnosis not present

## 2021-04-24 DIAGNOSIS — M7041 Prepatellar bursitis, right knee: Secondary | ICD-10-CM | POA: Diagnosis not present

## 2021-05-29 DIAGNOSIS — M1711 Unilateral primary osteoarthritis, right knee: Secondary | ICD-10-CM | POA: Diagnosis not present

## 2021-08-01 DIAGNOSIS — E042 Nontoxic multinodular goiter: Secondary | ICD-10-CM | POA: Diagnosis not present

## 2021-08-01 DIAGNOSIS — E049 Nontoxic goiter, unspecified: Secondary | ICD-10-CM | POA: Diagnosis not present

## 2021-10-02 DIAGNOSIS — D251 Intramural leiomyoma of uterus: Secondary | ICD-10-CM | POA: Diagnosis not present

## 2021-10-02 DIAGNOSIS — D252 Subserosal leiomyoma of uterus: Secondary | ICD-10-CM | POA: Diagnosis not present

## 2021-10-02 DIAGNOSIS — L409 Psoriasis, unspecified: Secondary | ICD-10-CM | POA: Diagnosis not present

## 2021-10-12 DIAGNOSIS — D252 Subserosal leiomyoma of uterus: Secondary | ICD-10-CM | POA: Diagnosis not present

## 2021-10-12 DIAGNOSIS — D251 Intramural leiomyoma of uterus: Secondary | ICD-10-CM | POA: Diagnosis not present

## 2021-10-25 DIAGNOSIS — D0512 Intraductal carcinoma in situ of left breast: Secondary | ICD-10-CM | POA: Diagnosis not present

## 2021-10-25 DIAGNOSIS — Z780 Asymptomatic menopausal state: Secondary | ICD-10-CM | POA: Diagnosis not present

## 2021-12-04 DIAGNOSIS — N858 Other specified noninflammatory disorders of uterus: Secondary | ICD-10-CM | POA: Diagnosis not present

## 2021-12-04 DIAGNOSIS — N809 Endometriosis, unspecified: Secondary | ICD-10-CM | POA: Diagnosis not present

## 2021-12-04 DIAGNOSIS — Z01818 Encounter for other preprocedural examination: Secondary | ICD-10-CM | POA: Diagnosis not present

## 2021-12-04 DIAGNOSIS — D261 Other benign neoplasm of corpus uteri: Secondary | ICD-10-CM | POA: Diagnosis not present

## 2021-12-04 DIAGNOSIS — R000 Tachycardia, unspecified: Secondary | ICD-10-CM | POA: Diagnosis not present

## 2021-12-04 DIAGNOSIS — Z86000 Personal history of in-situ neoplasm of breast: Secondary | ICD-10-CM | POA: Diagnosis not present

## 2021-12-04 DIAGNOSIS — N736 Female pelvic peritoneal adhesions (postinfective): Secondary | ICD-10-CM | POA: Diagnosis not present

## 2021-12-04 DIAGNOSIS — G8918 Other acute postprocedural pain: Secondary | ICD-10-CM | POA: Diagnosis not present

## 2021-12-04 DIAGNOSIS — D252 Subserosal leiomyoma of uterus: Secondary | ICD-10-CM | POA: Diagnosis not present

## 2021-12-04 DIAGNOSIS — N952 Postmenopausal atrophic vaginitis: Secondary | ICD-10-CM | POA: Diagnosis not present

## 2021-12-04 DIAGNOSIS — K660 Peritoneal adhesions (postprocedural) (postinfection): Secondary | ICD-10-CM | POA: Diagnosis not present

## 2021-12-04 DIAGNOSIS — K381 Appendicular concretions: Secondary | ICD-10-CM | POA: Diagnosis not present

## 2021-12-04 DIAGNOSIS — Z9010 Acquired absence of unspecified breast and nipple: Secondary | ICD-10-CM | POA: Diagnosis not present

## 2021-12-04 DIAGNOSIS — D251 Intramural leiomyoma of uterus: Secondary | ICD-10-CM | POA: Diagnosis not present

## 2021-12-04 DIAGNOSIS — N80201 Endometriosis of right fallopian tube, unspecified depth: Secondary | ICD-10-CM | POA: Diagnosis not present

## 2021-12-04 DIAGNOSIS — D259 Leiomyoma of uterus, unspecified: Secondary | ICD-10-CM | POA: Diagnosis not present

## 2021-12-04 DIAGNOSIS — N83291 Other ovarian cyst, right side: Secondary | ICD-10-CM | POA: Diagnosis not present

## 2021-12-07 DIAGNOSIS — G8918 Other acute postprocedural pain: Secondary | ICD-10-CM | POA: Diagnosis not present

## 2022-02-17 NOTE — Progress Notes (Unsigned)
No chief complaint on file.   HPI: Patient  Melissa Brown  56 y.o. comes in today for Preventive Health Care visit    Hemorrhoid procedure 10 23  Tahbso and lysis of adhesions  Ortho for knee pain  Health Maintenance  Topic Date Due   FOOT EXAM  Never done   HIV Screening  Never done   Diabetic kidney evaluation - Urine ACR  01/15/2014   Zoster Vaccines- Shingrix (2 of 2) 01/10/2018   OPHTHALMOLOGY EXAM  07/20/2018   COVID-19 Vaccine (3 - Pfizer risk series) 08/30/2019   MAMMOGRAM  01/11/2020   HEMOGLOBIN A1C  07/20/2021   INFLUENZA VACCINE  11/06/2021   Diabetic kidney evaluation - GFR measurement  01/19/2022   PAP SMEAR-Modifier  06/22/2022   COLONOSCOPY (Pts 45-32yr Insurance coverage will need to be confirmed)  07/20/2026   TETANUS/TDAP  07/19/2029   Hepatitis C Screening  Completed   HPV VACCINES  Aged Out   Health Maintenance Review LIFESTYLE:  Exercise:   Tobacco/ETS: Alcohol:  Sugar beverages: Sleep: Drug use: no HH of  Work:    ROS:  REST of 12 system review negative except as per HPI   Past Medical History:  Diagnosis Date   Allergic    to grass and moldsby skin testing    Angioedema of lips 12/07/2010   Hx of seem in August 12  remote hx of hives as a child.     Breast [place] ([place]   left dcis   [place]ast cancer ([place]    Diabetes, gestational    GESTATIONAL DIABETES 02/09/2007   Qualifier: History of  By: PRegis BillMD, WStandley Brooking   MVP (mitral valve prolapse)    ANTERIOR LEAFLET, MILD MR   Palpitations    tachy, supressed with atenolol   Scoliosis    Thyroid disease    HYPERTHYROIDISM    Past Surgical History:  Procedure Laterality Date   BREAST LUMPECTOMY     radiation DCIS- reconstruction left   CESAREAN SECTION      Family History  Problem Relation Age of Onset   Diabetes Mother    Thyroid disease Mother    Hypertension Mother    Diabetes Father    Heart disease Father    Diabetes Other     Social History   Socioeconomic  History   Marital status: Married    Spouse name: Not on file   Number of children: Not on file   Years of education: Not on file   Highest education level: Not on file  Occupational History   Not on file  Tobacco Use   Smoking status: Never   Smokeless tobacco: Never  Substance and Sexual Activity   Alcohol use: Yes    Alcohol/week: 1.0 standard drink of alcohol    Types: 1 Standard drinks or equivalent per week   Drug use: No   Sexual activity: Not on file  Other Topics Concern   Not on file  Social History Narrative   [place]    hhof 2.5 son at college , with dog   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     cetirizine (ZYRTEC) 10 MG tablet Allertec ([employer] Brand) '10mg'$  daily     Cholecalciferol (VITAMIN D-3 PO) Take by mouth.     CRANBERRY EXTRACT PO Take by mouth daily at 2 PM.     Misc Natural Products (OSTEO BI-FLEX/5-LOXIN ADVANCED PO) Take by mouth in the morning and at bedtime.     Multiple Vitamins-Calcium (ONE-A-DAY WOMENS PO) Take by mouth daily.       No facility-administered medications prior to visit.     EXAM:  There were no vitals taken for this visit.  There is no height or weight on file to calculate BMI. Wt Readings from Last 3 Encounters:  04/11/21 120 lb (54.4 kg)  02/14/21 120 lb 9.6 oz (54.7 kg)  07/20/19 124 lb 12.8 oz (56.6 kg)    Physical Exam: Vital signs reviewed ZOX:WRUE is a well-developed well-nourished alert cooperative    who appearsr  stated age in no acute distress.  HEENT: normocephalic atraumatic , Eyes: PERRL EOM's full, conjunctiva clear, Nares: paten,t no deformity discharge or tenderness., Ears: no deformity EAC's clear TMs with normal landmarks. Mouth: clear OP, no lesions, edema.  Moist mucous membranes. Dentition in adequate repair. NECK: supple without masses, thyromegaly or bruits. CHEST/PULM:  Clear to auscultation and percussion breath sounds equal no wheeze , rales or rhonchi. No chest wall deformities or tenderness. Breast: normal by inspection . No dimpling, discharge, masses, tenderness or discharge . CV: PMI is nondisplaced, S1 S2 no gallops, murmurs, rubs. Peripheral pulses are full without delay.No JVD .  ABDOMEN: Bowel sounds normal nontender  No guard or rebound, no hepato splenomegal no CVA tenderness.  No hernia. Extremtities:  No clubbing cyanosis or edema, no acute joint swelling or redness no focal atrophy NEURO:  Oriented x3, cranial nerves 3-12 appear to be intact, no obvious focal weakness,gait within normal limits no abnormal reflexes or asymmetrical SKIN: No acute rashes normal turgor, color, no bruising or petechiae. PSYCH: Oriented, good eye contact, no obvious depression anxiety, cognition and judgment appear normal. LN: no cervical axillary inguinal adenopathy  Lab Results  Component Value Date   WBC 4.5 01/19/2021   HGB 13.8 01/19/2021   HCT 40.4 01/19/2021   PLT 262.0 01/19/2021   GLUCOSE 111 (H) 01/19/2021   CHOL 246 (H) 01/19/2021   TRIG 160.0 (H) 01/19/2021   HDL 49.50 01/19/2021   LDLDIRECT 177.0 07/20/2019   LDLCALC 165 (H) 01/19/2021   ALT 20 01/19/2021   AST 20 01/19/2021   NA 136 01/19/2021   K 4.0 01/19/2021   CL 102 01/19/2021   CREATININE 0.68 01/19/2021   BUN 14 01/19/2021   CO2 26 01/19/2021   TSH 1.45 01/19/2021   HGBA1C 6.1 01/19/2021   MICROALBUR 0.5 01/15/2013    BP Readings from Last 3 Encounters:  04/11/21 118/76  02/14/21 (!) 150/80  07/20/19  118/80   IMPRESSION: 1. Lingular scarring. 3 mm right middle lobe nodule is most likely a benign subpleural lymph node. 2. 2.1 cm right thyroid nodule. Recommend thyroid US (ref: J Am Coll Radiol. 2015 Feb;12(2): 143-50). 3. Low-attenuation pancreatic tail lesion, stable and likely a pseudocyst if there is a history of pancreatitis. Cannot exclude cystic pancreatic neoplasm. Follow-up CT abdomen without and with contrast is recommended in 1 year. This recommendation follows  ACR consensus guidelines: Management of Incidental Pancreatic Cysts: A White Paper of the ACR Incidental Findings Committee. [place] 2863;81:771-165.   Labplan reviewed with patient   ASSESSMENT AND PLAN:  Discussed the following assessment and plan:    ICD-10-CM   1. Routine general medical examination at a health care facility  Z00.00     2. Fasting hyperglycemia  R73.01     3. Hyperlipidemia, unspecified hyperlipidemia type  E78.5    ct calcium coronary score 0 2022     No follow-ups on file.  Patient Care Team: Kayah Hecker, Standley Brooking, MD as PCP - Carrolyn Meiers, MD as Referring Physician (Obstetrics and Gynecology) Lindon Romp, MD as Consulting Physician (Internal Medicine) There are no Patient Instructions on file for this visit.  Standley Brooking. Shandria Clinch M.D.

## 2022-02-18 ENCOUNTER — Ambulatory Visit (INDEPENDENT_AMBULATORY_CARE_PROVIDER_SITE_OTHER): Payer: BLUE CROSS/BLUE SHIELD | Admitting: Internal Medicine

## 2022-02-18 ENCOUNTER — Encounter: Payer: Self-pay | Admitting: Internal Medicine

## 2022-02-18 VITALS — BP 124/82 | HR 98 | Temp 98.0°F | Ht 59.0 in | Wt 124.4 lb

## 2022-02-18 DIAGNOSIS — E049 Nontoxic goiter, unspecified: Secondary | ICD-10-CM

## 2022-02-18 DIAGNOSIS — Z853 Personal history of malignant neoplasm of breast: Secondary | ICD-10-CM

## 2022-02-18 DIAGNOSIS — Z0000 Encounter for general adult medical examination without abnormal findings: Secondary | ICD-10-CM | POA: Diagnosis not present

## 2022-02-18 DIAGNOSIS — L409 Psoriasis, unspecified: Secondary | ICD-10-CM | POA: Diagnosis not present

## 2022-02-18 DIAGNOSIS — K869 Disease of pancreas, unspecified: Secondary | ICD-10-CM | POA: Diagnosis not present

## 2022-02-18 DIAGNOSIS — E785 Hyperlipidemia, unspecified: Secondary | ICD-10-CM

## 2022-02-18 DIAGNOSIS — Z5181 Encounter for therapeutic drug level monitoring: Secondary | ICD-10-CM | POA: Diagnosis not present

## 2022-02-18 DIAGNOSIS — R7301 Impaired fasting glucose: Secondary | ICD-10-CM

## 2022-02-18 LAB — CBC WITH DIFFERENTIAL/PLATELET
Basophils Absolute: 0 10*3/uL (ref 0.0–0.1)
Basophils Relative: 0.7 % (ref 0.0–3.0)
Eosinophils Absolute: 0.1 10*3/uL (ref 0.0–0.7)
Eosinophils Relative: 2.9 % (ref 0.0–5.0)
HCT: 40.7 % (ref 36.0–46.0)
Hemoglobin: 13.7 g/dL (ref 12.0–15.0)
Lymphocytes Relative: 34.5 % (ref 12.0–46.0)
Lymphs Abs: 1.7 10*3/uL (ref 0.7–4.0)
MCHC: 33.8 g/dL (ref 30.0–36.0)
MCV: 91.6 fl (ref 78.0–100.0)
Monocytes Absolute: 0.4 10*3/uL (ref 0.1–1.0)
Monocytes Relative: 7.1 % (ref 3.0–12.0)
Neutro Abs: 2.7 10*3/uL (ref 1.4–7.7)
Neutrophils Relative %: 54.8 % (ref 43.0–77.0)
Platelets: 280 10*3/uL (ref 150.0–400.0)
RBC: 4.44 Mil/uL (ref 3.87–5.11)
RDW: 12.8 % (ref 11.5–15.5)
WBC: 5 10*3/uL (ref 4.0–10.5)

## 2022-02-18 LAB — HEMOGLOBIN A1C: Hgb A1c MFr Bld: 6.4 % (ref 4.6–6.5)

## 2022-02-18 NOTE — Patient Instructions (Addendum)
Will update labs . Today Non fasting ok  Will order  fu abd scan  as per radiology . To check the pancreas area  Good to see  you today .   Will   Take blood pressure readings twice a day for days 3-5 days and record .     Goal 130/80 and below  average Take 2 -3 readings at each sitting .   Can send in readings  by My Chart.     Before checking your blood pressure make sure: You are seated and quite for 5 min before checking Feet are flat on the floor Siting in chair with your back supported straight up and down Arm resting on table or arm of chair at heart level Bladder is empty You have NOT had caffeine or tobacco within the last 30 min  [email or web address]

## 2022-02-19 LAB — LIPID PANEL
Cholesterol: 262 mg/dL — ABNORMAL HIGH (ref 0–200)
HDL: 52 mg/dL (ref >39.00)
NonHDL: 209.57
Total CHOL/HDL Ratio: 5
Triglycerides: 270 mg/dL — ABNORMAL HIGH (ref 0.0–149.0)
VLDL: 54 mg/dL — ABNORMAL HIGH (ref 0.0–40.0)

## 2022-02-19 LAB — BASIC METABOLIC PANEL
BUN: 16 mg/dL (ref 6–23)
CO2: 32 mEq/L (ref 19–32)
Calcium: 9.7 mg/dL (ref 8.4–10.5)
Chloride: 99 mEq/L (ref 96–112)
Creatinine, Ser: 0.64 mg/dL (ref 0.40–1.20)
GFR: 98.42 mL/min (ref >60.00)
Glucose, Bld: 101 mg/dL — ABNORMAL HIGH (ref 70–99)
Potassium: 4 mEq/L (ref 3.5–5.1)
Sodium: 138 mEq/L (ref 135–145)

## 2022-02-19 LAB — LDL CHOLESTEROL, DIRECT: Direct LDL: 178 mg/dL

## 2022-02-22 DIAGNOSIS — Z853 Personal history of malignant neoplasm of breast: Secondary | ICD-10-CM | POA: Diagnosis not present

## 2022-02-22 DIAGNOSIS — R928 Other abnormal and inconclusive findings on diagnostic imaging of breast: Secondary | ICD-10-CM | POA: Diagnosis not present

## 2022-03-27 ENCOUNTER — Encounter: Payer: Self-pay | Admitting: Internal Medicine

## 2022-03-28 ENCOUNTER — Telehealth: Payer: Self-pay | Admitting: Internal Medicine

## 2022-03-28 NOTE — Telephone Encounter (Addendum)
Melissa Brown with [facility] [employer] Physician Reviewer  PA has been approved for the Ct scan of abdomen before and after contrast @ [facility] # [id number]  Informed that MD is out of office for several weeks  Please call [phone] (if provider or referral needs to be changed, etc.)  [email or web address]

## 2022-03-29 ENCOUNTER — Ambulatory Visit
Admission: RE | Admit: 2022-03-29 | Discharge: 2022-03-29 | Disposition: A | Discharge status: Home / Self Care | Payer: BLUE CROSS/BLUE SHIELD | Source: Ambulatory Visit | Attending: Internal Medicine | Admitting: Internal Medicine

## 2022-03-29 DIAGNOSIS — K869 Disease of pancreas, unspecified: Secondary | ICD-10-CM

## 2022-03-29 DIAGNOSIS — Z853 Personal history of malignant neoplasm of breast: Secondary | ICD-10-CM | POA: Diagnosis not present

## 2022-03-29 DIAGNOSIS — I700 Atherosclerosis of aorta: Secondary | ICD-10-CM | POA: Diagnosis not present

## 2022-03-29 DIAGNOSIS — K862 Cyst of pancreas: Secondary | ICD-10-CM | POA: Diagnosis not present

## 2022-03-29 DIAGNOSIS — Z9012 Acquired absence of left breast and nipple: Secondary | ICD-10-CM | POA: Diagnosis not present

## 2022-03-29 MED ORDER — IOPAMIDOL (ISOVUE-300) INJECTION 61%
100.0000 mL | Freq: Once | INTRAVENOUS | Status: AC | PRN
  Administered 2022-03-29: 100 mL via INTRAVENOUS

## 2022-04-03 DIAGNOSIS — D0512 Intraductal carcinoma in situ of left breast: Secondary | ICD-10-CM | POA: Diagnosis not present

## 2023-02-24 ENCOUNTER — Encounter: Payer: BLUE CROSS/BLUE SHIELD | Admitting: Internal Medicine

## 2023-02-26 NOTE — Progress Notes (Signed)
Chief Complaint  Patient presents with   Annual Exam    Pt would like to discuss 2nd shingrix.    HPI: Patient  Melissa Brown  57 y.o. comes in today for Preventive Health Care visit . Last pv 11 23   Derm :Psoriasis  yearly check Thyroid exam check  per ent  Has bump on lower left lip that she keeps  biting on  no bleeding? What to do . No diabetc vision neuropathy     Health Maintenance  Topic Date Due   FOOT EXAM  Never done   Zoster Vaccines- Shingrix (2 of 2) 01/10/2018   COVID-19 Vaccine (3 - Pfizer risk series) 03/15/2023 (Originally 08/30/2019)   MAMMOGRAM  04/11/2023 (Originally 01/11/2020)   OPHTHALMOLOGY EXAM  06/19/2023 (Originally 07/20/2018)   INFLUENZA VACCINE  07/07/2023 (Originally 11/07/2022)   HIV Screening  02/27/2024 (Originally 03/08/1981)   HEMOGLOBIN A1C  08/27/2023   Diabetic kidney evaluation - eGFR measurement  02/27/2024   Diabetic kidney evaluation - Urine ACR  02/27/2024   Colonoscopy  09/24/2027   DTaP/Tdap/Td (3 - Td or Tdap) 07/19/2029   Hepatitis C Screening  Completed   HPV VACCINES  Aged Out   Health Maintenance Review LIFESTYLE:  Exercise:  3 d per week  30  min  some weights .  Tobacco/ETS: no Alcohol: ocass Sugar beverages: ocass Sleep: 7  Drug use: no HH of  2-3  1 dog  Work:  over 40  .     ROS:  GEN/ HEENT: No fever, significant weight changes sweats headaches vision problems hearing changes, CV/ PULM; No chest pain shortness of breath cough, syncope,edema  change in exercise tolerance. GI /GU: No adominal pain, vomiting, change in bowel habits. No blood in the stool. No significant GU symptoms. SKIN/HEME: ,no acute skin rashes suspicious lesions or bleeding. No lymphadenopathy, nodules, masses.  NEURO/ PSYCH:  No neurologic signs such as weakness numbness. No depression anxiety. IMM/ Allergy: No unusual infections.  Allergy .   REST of 12 system review negative except as per HPI   Past Medical History:  Diagnosis Date    Allergic    to grass and moldsby skin testing    Angioedema of lips 12/07/2010   Hx of seem in August 12  remote hx of hives as a child.     Breast cancer ([facility]) 2005   left dcis   Breast cancer ([facility])    Diabetes, gestational    GESTATIONAL DIABETES 02/09/2007   Qualifier: History of  By: Fabian Sharp MD, Neta Mends    MVP (mitral valve prolapse)    ANTERIOR LEAFLET, MILD MR   Palpitations    tachy, supressed with atenolol   Scoliosis    Thyroid disease    HYPERTHYROIDISM    Past Surgical History:  Procedure Laterality Date   APPENDECTOMY     BREAST LUMPECTOMY     radiation DCIS- reconstruction left   CESAREAN SECTION     TOTAL ABDOMINAL HYSTERECTOMY W/ BILATERAL SALPINGOOPHORECTOMY      Family History  Problem Relation Age of Onset   Diabetes Mother    Thyroid disease Mother    Hypertension Mother    Diabetes Father    Heart disease Father    Diabetes Other     Social History   Socioeconomic History   Marital status: Married    Spouse name: Not on file   Number of children: Not on file   Years of education: Not on file   Highest  education level: Not on file  Occupational History   Not on file  Tobacco Use   Smoking status: Never   Smokeless tobacco: Never  Substance and Sexual Activity   Alcohol use: Yes    Alcohol/week: 1.0 standard drink of alcohol    Types: 1 Standard drinks or equivalent per week   Drug use: No   Sexual activity: Not on file  Other Topics Concern   Not on file  Social History Narrative   Born [place] in [place] 25 yes    hhof 2.5 son at college , with dog   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   Married   Never Smoked   Alcohol use-no   Sleep 7 hours to 9   To do online degree accounting    Social Determinants of Health   Financial Resource Strain: Not on file  Food Insecurity: Not on file  Transportation Needs: Not on file  Physical Activity: Insufficiently Active (02/27/2023)   Exercise Vital Sign    Days of Exercise per Week: 3 days     Minutes of Exercise per Session: 30 min  Stress: Not on file  Social Connections: Not on file    Outpatient Medications Prior to Visit  Medication Sig Dispense Refill   augmented betamethasone dipropionate (DIPROLENE-AF) 0.05 % cream Apply topically.     cetirizine (ZYRTEC) 10 MG tablet Allertec ([employer] Brand) 10mg  daily     Cholecalciferol (VITAMIN D-3 PO) Take by mouth.     clobetasol cream (TEMOVATE) 0.05 % Apply to neck rash BID x 3 weeks, then PRN     CRANBERRY EXTRACT PO Take by mouth daily at 2 PM.     Estradiol 10 MCG TABS vaginal tablet 1 per vagina HS twice weekly     hydrocortisone (ANUSOL-HC) 2.5 % rectal cream PRN     Misc Natural Products (OSTEO BI-FLEX/5-LOXIN ADVANCED PO) Take by mouth in the morning and at bedtime.     Multiple Vitamins-Calcium (ONE-A-DAY WOMENS PO) Take by mouth daily.       Omega-3 1000 MG CAPS Take by mouth daily.     No facility-administered medications prior to visit.     EXAM:  BP (!) 152/96 (BP Location: Right Arm, Cuff Size: Normal)   Pulse 70   Temp 97.9 F (36.6 C) (Oral)   Ht 4\' 11"  (1.499 m)   Wt 123 lb 6.4 oz (56 kg)   SpO2 98%   BMI 24.92 kg/m   Body mass index is 24.92 kg/m. Wt Readings from Last 3 Encounters:  02/27/23 123 lb 6.4 oz (56 kg)  02/18/22 124 lb 6.4 oz (56.4 kg)  04/11/21 120 lb (54.4 kg)   BP Readings from Last 3 Encounters:  02/27/23 (!) 152/96  02/18/22 124/82  04/11/21 118/76     Physical Exam: Vital signs reviewed [id number] is a well-developed well-nourished alert cooperative    who appearsr stated age in no acute distress.  HEENT: normocephalic atraumatic , Eyes: PERRL EOM's full, conjunctiva clear, Nares: paten,t no deformity discharge or tenderness., Ears: no deformity EAC's clear TMs with normal landmarks. Mouth: clear  Left lower lip with 3 mm muicosal lesgrrowth  no color change  OP, no lesions,otherwise  edema.  Moist mucous membranes. Dentition in adequate repair. NECK: supple without  masses, thyroid palpable  no bruits. CHEST/PULM:  Clear to auscultation and percussion breath sounds equal no wheeze , rales or rhonchi. No chest wall deformities or tenderness. Breast: normal by inspection . No dimpling, discharge,  masses, tenderness or discharge . CV: PMI is nondisplaced, S1 S2 no gallops, murmurs, rubs. Peripheral pulses are full without delay.No JVD .  ABDOMEN: Bowel sounds normal nontender  No guard or rebound, no hepato splenomegal no CVA tenderness. Extremtities:  No clubbing cyanosis or edema, no acute joint swelling or redness no focal atrophy NEURO:  Oriented x3, cranial nerves 3-12 appear to be intact, no obvious focal weakness,gait within normal limits no abnormal reflexes or asymmetrical SKIN: No acute rashes normal turgor, color, no bruising or petechiae. PSYCH: Oriented, good eye contact, no obvious depression anxiety, cognition and judgment appear normal. LN: no cervical axillary adenopathy  Lab Results  Component Value Date   WBC 5.4 02/27/2023   HGB 14.1 02/27/2023   HCT 43.0 02/27/2023   PLT 283.0 02/27/2023   GLUCOSE 121 (H) 02/27/2023   CHOL 308 (H) 02/27/2023   TRIG 211.0 (H) 02/27/2023   HDL 52.00 02/27/2023   LDLDIRECT 178.0 02/18/2022   LDLCALC 213 (H) 02/27/2023   ALT 24 02/27/2023   AST 22 02/27/2023   NA 138 02/27/2023   K 4.7 02/27/2023   CL 102 02/27/2023   CREATININE 0.66 02/27/2023   BUN 13 02/27/2023   CO2 27 02/27/2023   TSH 0.89 02/27/2023   HGBA1C 6.4 02/27/2023   MICROALBUR <0.7 02/27/2023    BP Readings from Last 3 Encounters:  02/27/23 (!) 152/96  02/18/22 124/82  04/11/21 118/76   Ct calcium score 2022 0 Lab plan reviewed with patient  IMPRESSION: 1. Greater than 2 year size stability of a cystic lesion within the pancreatic body/tail junction. Most likely a pseudocyst versus less likely indolent cystic neoplasm. Per consensus criteria, follow-up with pre and post-contrast pancreatic protocol CT or MRI at 1  year is recommended. 2.  No acute abdominal process. 3.  Aortic Atherosclerosis (ICD10-I70.0).  12 2023 ASSESSMENT AND PLAN:  Discussed the following assessment and plan:    ICD-10-CM   1. Routine general medical examination at a health care facility  Z00.00 CBC with Differential/Platelet    Comprehensive metabolic panel    Lipid panel    TSH    Hemoglobin A1c    Microalbumin/Creatinine Ratio, Urine    Vitamin D, 25-hydroxy    2. Fasting hyperglycemia  R73.01 CBC with Differential/Platelet    Comprehensive metabolic panel    Lipid panel    TSH    Hemoglobin A1c    Microalbumin/Creatinine Ratio, Urine    Vitamin D, 25-hydroxy    3. Lip lesion  K13.0 CBC with Differential/Platelet    Comprehensive metabolic panel    Lipid panel    TSH    Hemoglobin A1c    Microalbumin/Creatinine Ratio, Urine    Vitamin D, 25-hydroxy    4. Personal history of malignant neoplasm of breast  Z85.3 CBC with Differential/Platelet    Comprehensive metabolic panel    Lipid panel    TSH    Hemoglobin A1c    Microalbumin/Creatinine Ratio, Urine    Vitamin D, 25-hydroxy    5. Hyperlipidemia, unspecified hyperlipidemia type  E78.5 CBC with Differential/Platelet    Comprehensive metabolic panel    Lipid panel    TSH    Hemoglobin A1c    Microalbumin/Creatinine Ratio, Urine    Vitamin D, 25-hydroxy    6. Psoriasis  L40.9 CBC with Differential/Platelet    Comprehensive metabolic panel    Lipid panel    TSH    Hemoglobin A1c    Microalbumin/Creatinine Ratio, Urine    Vitamin D,  25-hydroxy    7. Medication management  Z79.899 CBC with Differential/Platelet    Comprehensive metabolic panel    Lipid panel    TSH    Hemoglobin A1c    Microalbumin/Creatinine Ratio, Urine    Vitamin D, 25-hydroxy    8. Lesion of pancreas incidental finding on ct 2021  K86.9 CBC with Differential/Platelet    Comprehensive metabolic panel    Lipid panel    TSH    Hemoglobin A1c    Microalbumin/Creatinine  Ratio, Urine    Vitamin D, 25-hydroxy    MR Abdomen W Wo Contrast    9. Elevated blood pressure reading  R03.0 CBC with Differential/Platelet    Comprehensive metabolic panel    Lipid panel    TSH    Hemoglobin A1c    Microalbumin/Creatinine Ratio, Urine    Vitamin D, 25-hydroxy    10. Vitamin D deficiency  E55.9 CBC with Differential/Platelet    Comprehensive metabolic panel    Lipid panel    TSH    Hemoglobin A1c    Microalbumin/Creatinine Ratio, Urine    Vitamin D, 25-hydroxy    Will plan updated mri pancrease as  advised  OS plan for lip lesion  BP and lipid evaluation   last ct calcium score was 0 22   Can get shingrix any time doesn't have to "start over" Return in about 3 months (around 05/30/2023) for depending on results   .  Patient Care Team: Alazar Cherian, Neta Mends, MD as PCP - Pollyann Kennedy, MD as Referring Physician (Obstetrics and Gynecology) Aggie Cosier, MD as Consulting Physician (Internal Medicine) Patient Instructions  Good to see you today . The mouth  lesion seems benign but would advise removal  Let us know and we can do a referral to oral surgery.  Lab today   Get regular dental care  Ok to get the 2# Shingrix  at any time. Pharmacy ok .  Please bring your blood pressure cuff to an  appointment to vet the readings  Take blood pressure readings twice a day for 5 + days and record .     Take 2 -3 readings at each sitting .   Can send in readings  by My Chart.     Before checking your blood pressure make sure: You are seated and quite for 5 min before checking Feet are flat on the floor Siting in chair with your back supported straight up and down Arm resting on table or arm of chair at heart level Bladder is empty You have NOT had caffeine or tobacco within the last 30 min  [email or web address]  MRI of pancreas is due to fu  IMPRESSION: 1. Greater than 2 year size stability of a cystic lesion within the pancreatic body/tail  junction. Most likely a pseudocyst versus less likely indolent cystic neoplasm. Per consensus criteria, follow-up with pre and post-contrast pancreatic protocol CT or MRI at 1 year is recommended.     Neta Mends. Sarah Zerby M.D.

## 2023-02-27 ENCOUNTER — Ambulatory Visit: Payer: Self-pay | Admitting: Internal Medicine

## 2023-02-27 ENCOUNTER — Encounter: Payer: Self-pay | Admitting: Internal Medicine

## 2023-02-27 VITALS — BP 152/96 | HR 70 | Temp 97.9°F | Ht 59.0 in | Wt 123.4 lb

## 2023-02-27 DIAGNOSIS — L409 Psoriasis, unspecified: Secondary | ICD-10-CM

## 2023-02-27 DIAGNOSIS — K130 Diseases of lips: Secondary | ICD-10-CM | POA: Diagnosis not present

## 2023-02-27 DIAGNOSIS — R7301 Impaired fasting glucose: Secondary | ICD-10-CM | POA: Diagnosis not present

## 2023-02-27 DIAGNOSIS — E785 Hyperlipidemia, unspecified: Secondary | ICD-10-CM

## 2023-02-27 DIAGNOSIS — E559 Vitamin D deficiency, unspecified: Secondary | ICD-10-CM | POA: Diagnosis not present

## 2023-02-27 DIAGNOSIS — Z0000 Encounter for general adult medical examination without abnormal findings: Secondary | ICD-10-CM

## 2023-02-27 DIAGNOSIS — Z79899 Other long term (current) drug therapy: Secondary | ICD-10-CM

## 2023-02-27 DIAGNOSIS — Z853 Personal history of malignant neoplasm of breast: Secondary | ICD-10-CM | POA: Diagnosis not present

## 2023-02-27 DIAGNOSIS — K869 Disease of pancreas, unspecified: Secondary | ICD-10-CM

## 2023-02-27 DIAGNOSIS — R030 Elevated blood-pressure reading, without diagnosis of hypertension: Secondary | ICD-10-CM

## 2023-02-27 DIAGNOSIS — I059 Rheumatic mitral valve disease, unspecified: Secondary | ICD-10-CM

## 2023-02-27 LAB — COMPREHENSIVE METABOLIC PANEL
ALT: 24 U/L (ref 0–35)
AST: 22 U/L (ref 0–37)
Albumin: 4.7 g/dL (ref 3.5–5.2)
Alkaline Phosphatase: 84 U/L (ref 39–117)
BUN: 13 mg/dL (ref 6–23)
CO2: 27 meq/L (ref 19–32)
Calcium: 10 mg/dL (ref 8.4–10.5)
Chloride: 102 meq/L (ref 96–112)
Creatinine, Ser: 0.66 mg/dL (ref 0.40–1.20)
GFR: 97 mL/min (ref >60.00)
Glucose, Bld: 121 mg/dL — ABNORMAL HIGH (ref 70–99)
Potassium: 4.7 meq/L (ref 3.5–5.1)
Sodium: 138 meq/L (ref 135–145)
Total Bilirubin: 0.5 mg/dL (ref 0.2–1.2)
Total Protein: 8.4 g/dL — ABNORMAL HIGH (ref 6.0–8.3)

## 2023-02-27 LAB — CBC WITH DIFFERENTIAL/PLATELET
Basophils Absolute: 0 10*3/uL (ref 0.0–0.1)
Basophils Relative: 0.4 % (ref 0.0–3.0)
Eosinophils Absolute: 0.2 10*3/uL (ref 0.0–0.7)
Eosinophils Relative: 4.4 % (ref 0.0–5.0)
HCT: 43 % (ref 36.0–46.0)
Hemoglobin: 14.1 g/dL (ref 12.0–15.0)
Lymphocytes Relative: 29.2 % (ref 12.0–46.0)
Lymphs Abs: 1.6 10*3/uL (ref 0.7–4.0)
MCHC: 32.8 g/dL (ref 30.0–36.0)
MCV: 93.7 fL (ref 78.0–100.0)
Monocytes Absolute: 0.3 10*3/uL (ref 0.1–1.0)
Monocytes Relative: 5.7 % (ref 3.0–12.0)
Neutro Abs: 3.2 10*3/uL (ref 1.4–7.7)
Neutrophils Relative %: 60.3 % (ref 43.0–77.0)
Platelets: 283 10*3/uL (ref 150.0–400.0)
RBC: 4.59 Mil/uL (ref 3.87–5.11)
RDW: 13.2 % (ref 11.5–15.5)
WBC: 5.4 10*3/uL (ref 4.0–10.5)

## 2023-02-27 LAB — MICROALBUMIN / CREATININE URINE RATIO
Creatinine,U: 16.3 mg/dL
Microalb Creat Ratio: 4.3 mg/g (ref 0.0–30.0)
Microalb, Ur: <0.7 mg/dL (ref 0.0–1.9)

## 2023-02-27 LAB — LIPID PANEL
Cholesterol: 308 mg/dL — ABNORMAL HIGH (ref 0–200)
HDL: 52 mg/dL (ref >39.00)
LDL Cholesterol: 213 mg/dL — ABNORMAL HIGH (ref 0–99)
NonHDL: 255.57
Total CHOL/HDL Ratio: 6
Triglycerides: 211 mg/dL — ABNORMAL HIGH (ref 0.0–149.0)
VLDL: 42.2 mg/dL — ABNORMAL HIGH (ref 0.0–40.0)

## 2023-02-27 LAB — VITAMIN D 25 HYDROXY (VIT D DEFICIENCY, FRACTURES): VITD: 33.58 ng/mL (ref 30.00–100.00)

## 2023-02-27 LAB — TSH: TSH: 0.89 u[IU]/mL (ref 0.35–5.50)

## 2023-02-27 LAB — HEMOGLOBIN A1C: Hgb A1c MFr Bld: 6.4 % (ref 4.6–6.5)

## 2023-02-27 NOTE — Patient Instructions (Addendum)
Good to see you today . The mouth  lesion seems benign but would advise removal  Let us know and we can do a referral to oral surgery.  Lab today   Get regular dental care  Ok to get the 2# Shingrix  at any time. Pharmacy ok .  Please bring your blood pressure cuff to an  appointment to vet the readings  Take blood pressure readings twice a day for 5 + days and record .     Take 2 -3 readings at each sitting .   Can send in readings  by My Chart.     Before checking your blood pressure make sure: You are seated and quite for 5 min before checking Feet are flat on the floor Siting in chair with your back supported straight up and down Arm resting on table or arm of chair at heart level Bladder is empty You have NOT had caffeine or tobacco within the last 30 min  [email or web address]  MRI of pancreas is due to fu  IMPRESSION: 1. Greater than 2 year size stability of a cystic lesion within the pancreatic body/tail junction. Most likely a pseudocyst versus less likely indolent cystic neoplasm. Per consensus criteria, follow-up with pre and post-contrast pancreatic protocol CT or MRI at 1 year is recommended.

## 2023-03-03 ENCOUNTER — Encounter: Payer: Self-pay | Admitting: Internal Medicine

## 2023-03-03 DIAGNOSIS — K130 Diseases of lips: Secondary | ICD-10-CM

## 2023-03-04 NOTE — Telephone Encounter (Signed)
Yes please do a referral  to designated OS for lip mouth lesion evaluation removal

## 2023-03-23 NOTE — Progress Notes (Signed)
So now your cholesterol is so high that statin or medication lowering is advised even though your last calcium score was 0 in 2022.  You can make a visit or  virtual appt to discuss  options   Either way would repeat fasting lipid panel with lipo a level  and hg A1c   in  Feb 25

## 2023-03-24 ENCOUNTER — Other Ambulatory Visit: Payer: Self-pay

## 2023-03-24 DIAGNOSIS — Z79899 Other long term (current) drug therapy: Secondary | ICD-10-CM

## 2023-03-24 DIAGNOSIS — E785 Hyperlipidemia, unspecified: Secondary | ICD-10-CM

## 2023-03-24 DIAGNOSIS — R7301 Impaired fasting glucose: Secondary | ICD-10-CM

## 2023-03-31 ENCOUNTER — Encounter: Payer: Self-pay | Admitting: Internal Medicine

## 2023-04-03 ENCOUNTER — Ambulatory Visit: Payer: 59 | Admitting: Internal Medicine

## 2023-04-03 ENCOUNTER — Telehealth: Payer: Self-pay

## 2023-04-03 ENCOUNTER — Encounter: Payer: Self-pay | Admitting: Internal Medicine

## 2023-04-03 VITALS — BP 151/97 | HR 103 | Temp 98.0°F | Ht 59.0 in | Wt 126.4 lb

## 2023-04-03 DIAGNOSIS — E785 Hyperlipidemia, unspecified: Secondary | ICD-10-CM

## 2023-04-03 DIAGNOSIS — R7303 Prediabetes: Secondary | ICD-10-CM | POA: Diagnosis not present

## 2023-04-03 DIAGNOSIS — Z79899 Other long term (current) drug therapy: Secondary | ICD-10-CM

## 2023-04-03 DIAGNOSIS — I10 Essential (primary) hypertension: Secondary | ICD-10-CM

## 2023-04-03 MED ORDER — ROSUVASTATIN CALCIUM 5 MG PO TABS: 5.0000 mg | ORAL_TABLET | Freq: Every day | ORAL | 3 refills | Status: DC

## 2023-04-03 MED ORDER — LOSARTAN POTASSIUM 50 MG PO TABS: 50.0000 mg | ORAL_TABLET | Freq: Every day | ORAL | 1 refills | Status: DC

## 2023-04-03 NOTE — Telephone Encounter (Signed)
Pt reports the facility misplaced the referral and request a new referral needs to be resend.   Can you refax it or do I need to placed a new order?  Please advise.

## 2023-04-03 NOTE — Progress Notes (Signed)
Add bmp to this order set .

## 2023-04-03 NOTE — Progress Notes (Signed)
Chief Complaint  Patient presents with   Medical Management of Chronic Issues    F/u lab. Discuss BP. Pt request a referral to be resend. Inform pt will follow up with that.    HPI: Melissa Brown 57 y.o. come in for fu rising lipid levels  see last notes   Bp monitor r arm only  running higher?  150 +    No explanation for lipid ldl being higher   Neg fam hx mi or cva but ht and dm.  OS referral ? Needs to be resent?    ROS: See pertinent positives and negatives per HPI.  Past Medical History:  Diagnosis Date   Allergic    to grass and moldsby skin testing    Angioedema of lips 12/07/2010   Hx of seem in August 12  remote hx of hives as a child.     Breast cancer (HCC) 2005   left dcis   Breast cancer (HCC)    Diabetes, gestational    GESTATIONAL DIABETES 02/09/2007   Qualifier: History of  By: Fabian Sharp MD, Neta Mends    MVP (mitral valve prolapse)    ANTERIOR LEAFLET, MILD MR   Palpitations    tachy, supressed with atenolol   Scoliosis    Thyroid disease    HYPERTHYROIDISM    Family History  Problem Relation Age of Onset   Diabetes Mother    Thyroid disease Mother    Hypertension Mother    Diabetes Father    Heart disease Father    Diabetes Other     Social History   Socioeconomic History   Marital status: Married    Spouse name: Not on file   Number of children: Not on file   Years of education: Not on file   Highest education level: Not on file  Occupational History   Not on file  Tobacco Use   Smoking status: Never   Smokeless tobacco: Never  Substance and Sexual Activity   Alcohol use: Yes    Alcohol/week: 1.0 standard drink of alcohol    Types: 1 Standard drinks or equivalent per week   Drug use: No   Sexual activity: Not on file  Other Topics Concern   Not on file  Social History Narrative   Born [place] in [place] 25 yes    hhof 2.5 son at college , with dog   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     cetirizine (ZYRTEC) 10 MG tablet Allertec ([employer] Brand) 10mg  daily     Cholecalciferol (VITAMIN D-3 PO) Take by mouth.     clobetasol cream (TEMOVATE) 0.05 % Apply to neck rash BID x 3 weeks, then PRN     CRANBERRY EXTRACT PO Take by mouth daily at 2 PM.     Estradiol  10 MCG TABS vaginal tablet 1 per vagina HS twice weekly     hydrocortisone (ANUSOL-HC) 2.5 % rectal cream PRN     Misc Natural Products (OSTEO BI-FLEX/5-LOXIN ADVANCED PO) Take by mouth in the morning and at bedtime.     Multiple Vitamins-Calcium (ONE-A-DAY WOMENS PO) Take by mouth daily.       Omega-3 1000 MG CAPS Take by mouth daily.     No facility-administered medications prior to visit.     EXAM:  BP (!) 151/97 Comment: Home blood pressure monitor  Pulse (!) 103   Temp 98 F (36.7 C) (Oral)   Ht 4\' 11"  (1.499 m)   Wt 126 lb 6.4 oz (57.3 kg)   SpO2 98%   BMI 25.53 kg/m   Body mass index is 25.53 kg/m. Multiple bp readings with her machine and our office cuff  machine runs somewhat higher  5-7 but  her readings are  up and down labile  lower is 149/92 152/86   other 161/97  150/78 GENERAL: vitals reviewed and listed above, alert, oriented, appears well hydrated and in no acute distress HEENT: atraumatic, conjunctiva  clear, no obvious abnormalities on inspection of external nose and ears MS: moves all extremities without noticeable focal   abnormality PSYCH: pleasant and cooperative, no obvious depression or anxiety Lab Results  Component Value Date   WBC 5.4 02/27/2023   HGB 14.1 02/27/2023   HCT 43.0 02/27/2023   PLT 283.0 02/27/2023   GLUCOSE 121 (H) 02/27/2023   CHOL 308 (H) 02/27/2023   TRIG 211.0 (H) 02/27/2023   HDL 52.00 02/27/2023   LDLDIRECT 178.0 02/18/2022   LDLCALC 213 (H) 02/27/2023   ALT 24 02/27/2023   AST 22 02/27/2023   NA 138 02/27/2023   K 4.7 02/27/2023   CL 102 02/27/2023   CREATININE 0.66 02/27/2023   BUN 13 02/27/2023   CO2 27 02/27/2023   TSH 0.89 02/27/2023   HGBA1C 6.4 02/27/2023   MICROALBUR <0.7 02/27/2023   BP Readings from Last 3 Encounters:  04/03/23 (!) 151/97  02/27/23 (!) 152/96  02/18/22 124/82    ASSESSMENT AND PLAN:  Discussed the following assessment and plan:  Essential hypertension  Hyperlipidemia, unspecified hyperlipidemia type  Medication management  Prediabetes - at risk  Although home monitor seems accurate and labile readings all are aboe goal and has strong fam hx  Good enough to  monitor at home   Begin low dose  losartan 50 per day  Low dose crestor 5 per day  Plan labs in about 3 mos( lipid lipo a bmp  A1c  and then visit appt  to assess bp and lipid plan  Pt requested [facility] to [facility] Resend info on os referral for lip lesion -Patient advised to return or notify health care team  if  new concerns arise.  Patient Instructions  Bp  control   goal 130/80 range  average .  You monitor is a bit higher reading than ours but if consistent should still be ok to use for monitoring . We will add  medication to help .   Adding low dose rosuvastatin for cholesterol control  Take daily as possible .  Plan  3 months lab and then visit to fu on BP control and  cholesterol  levels.    Neta Mends. Helaine Yackel M.D.

## 2023-04-03 NOTE — Patient Instructions (Addendum)
Bp  control   goal 130/80 range  average .  You monitor is a bit higher reading than ours but if consistent should still be ok to use for monitoring . We will add  medication to help .   Adding low dose rosuvastatin for cholesterol control  Take daily as possible .  Plan  3 months lab and then visit to fu on BP control and  cholesterol  levels.

## 2023-04-07 ENCOUNTER — Inpatient Hospital Stay: Admission: RE | Admit: 2023-04-07 | Payer: 59 | Source: Ambulatory Visit

## 2023-05-01 ENCOUNTER — Other Ambulatory Visit: Payer: 59

## 2023-05-05 ENCOUNTER — Ambulatory Visit
Admission: RE | Admit: 2023-05-05 | Discharge: 2023-05-05 | Disposition: A | Discharge status: Home / Self Care | Payer: 59 | Source: Ambulatory Visit | Attending: Internal Medicine | Admitting: Internal Medicine

## 2023-05-05 DIAGNOSIS — K869 Disease of pancreas, unspecified: Secondary | ICD-10-CM

## 2023-05-05 MED ORDER — GADOPICLENOL 0.5 MMOL/ML IV SOLN
6.0000 mL | Freq: Once | INTRAVENOUS | Status: AC | PRN
Start: 2023-05-05 — End: 2023-05-05
  Administered 2023-05-05: 6 mL via INTRAVENOUS

## 2023-05-09 ENCOUNTER — Encounter: Payer: Self-pay | Admitting: Internal Medicine

## 2023-05-09 NOTE — Progress Notes (Signed)
Mri of pancreas  shows no change in cyst and felt to benign  and no further  imagining is advised  Reassuring     no other significant abnormalities  noted on scan

## 2023-05-29 ENCOUNTER — Ambulatory Visit: Payer: 59 | Admitting: Internal Medicine

## 2023-06-09 IMAGING — DX DG KNEE COMPLETE 4+V*R*
4 series · 4 of 4 positions shown · non-contrast
Comparison: None.

CLINICAL DATA: Pain.

EXAM:
RIGHT KNEE - COMPLETE 4+ VIEW

[knee ap]
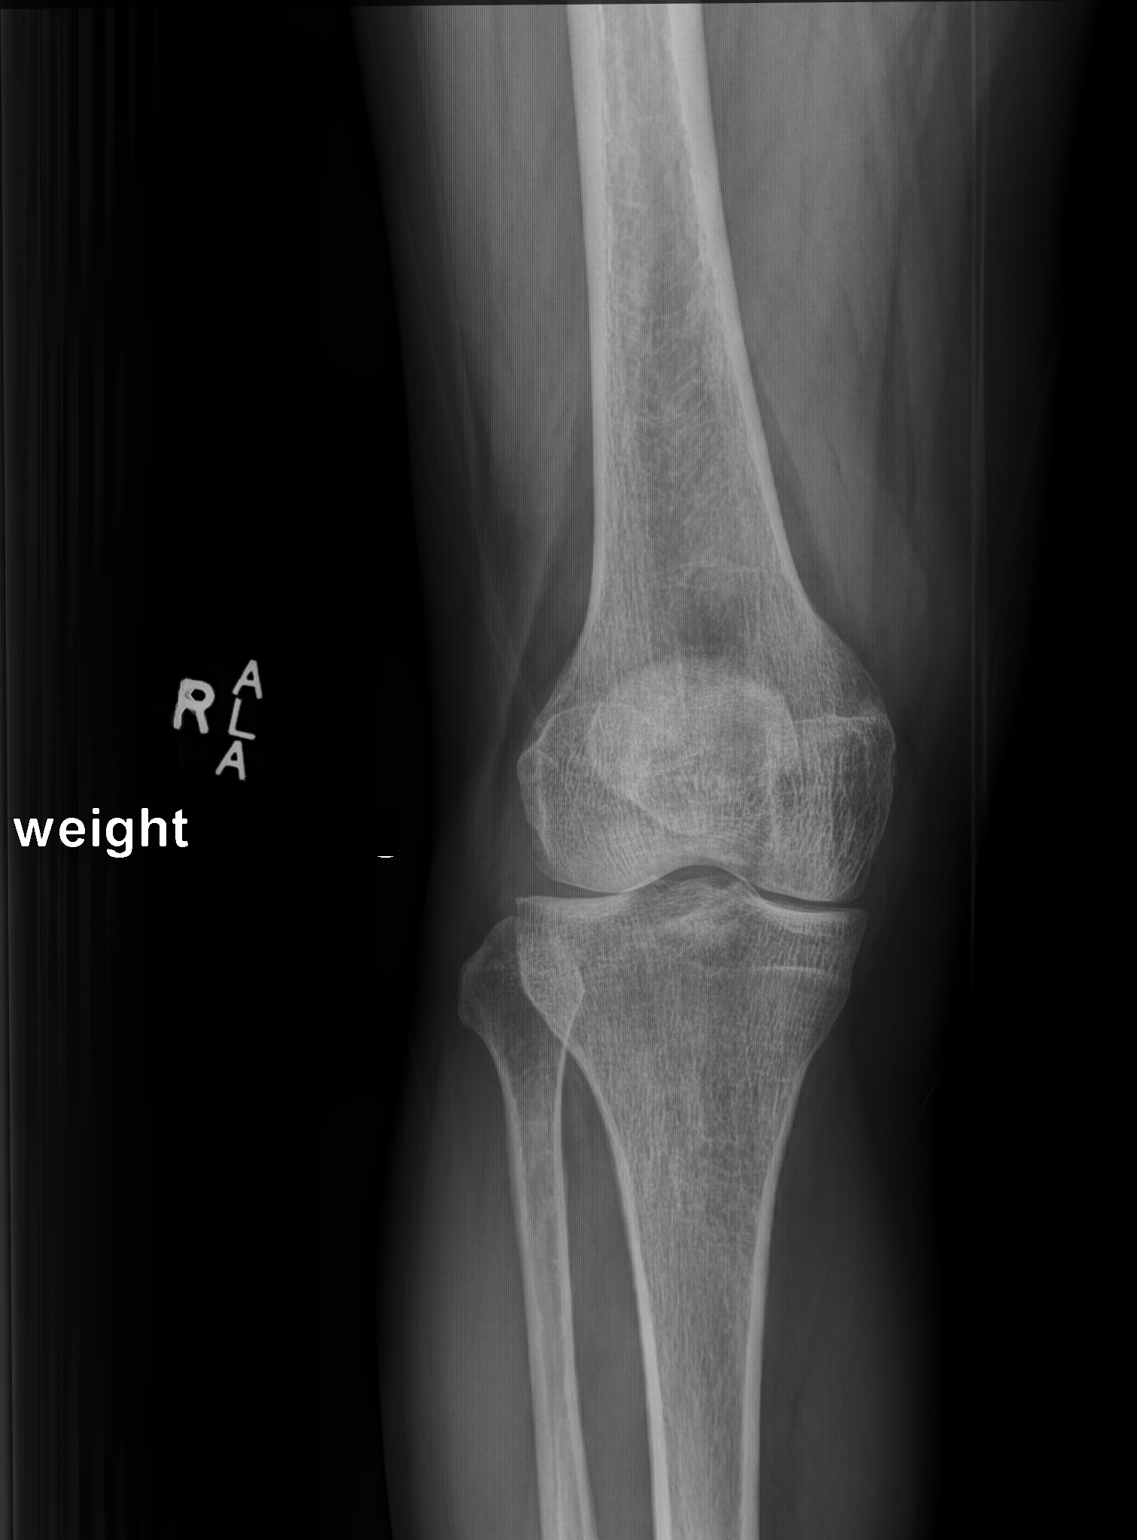

[knee [person_name] view pa]
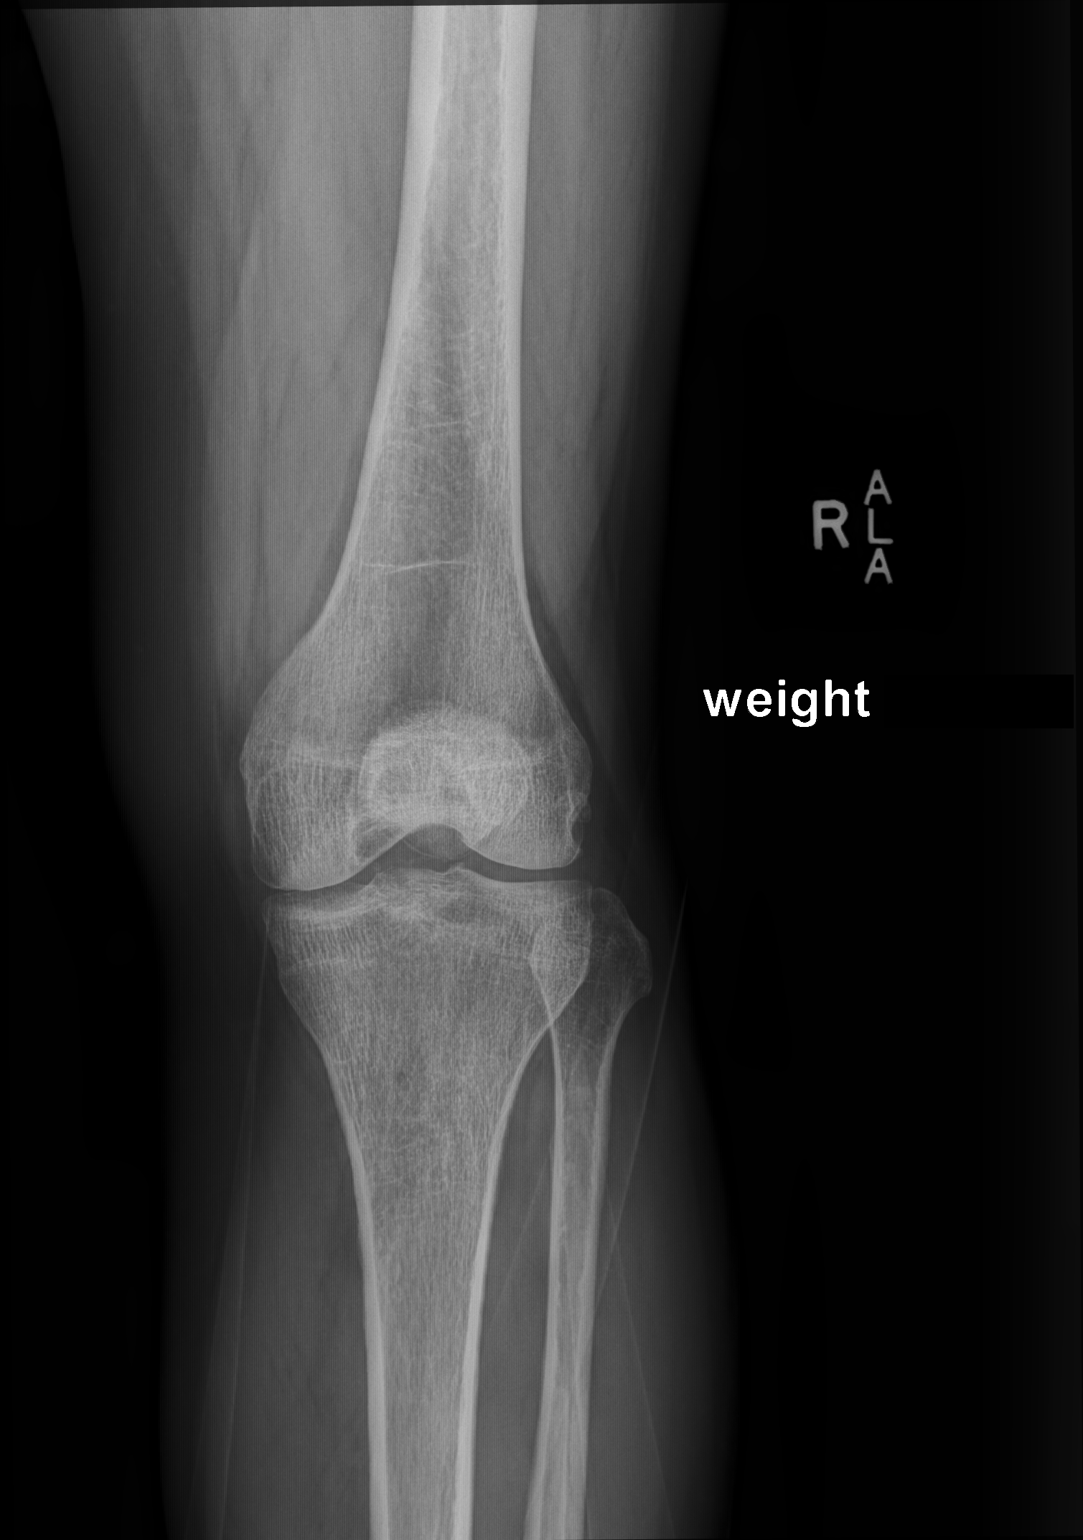

[patella (sunrise) tan]
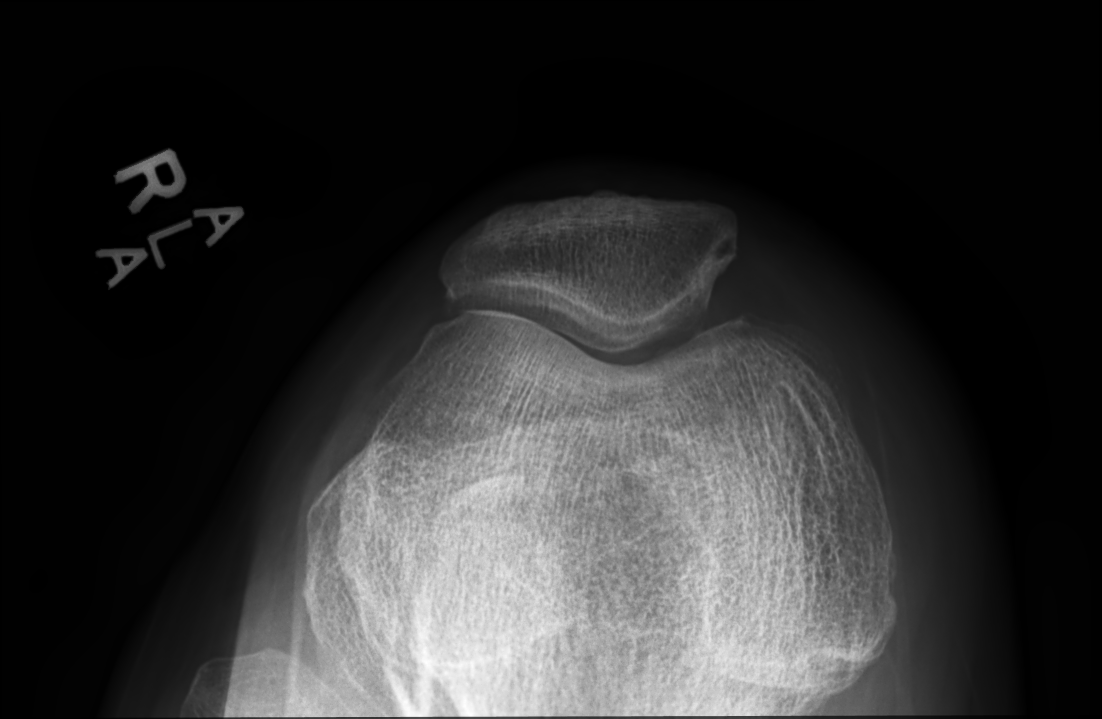

[knee lat]
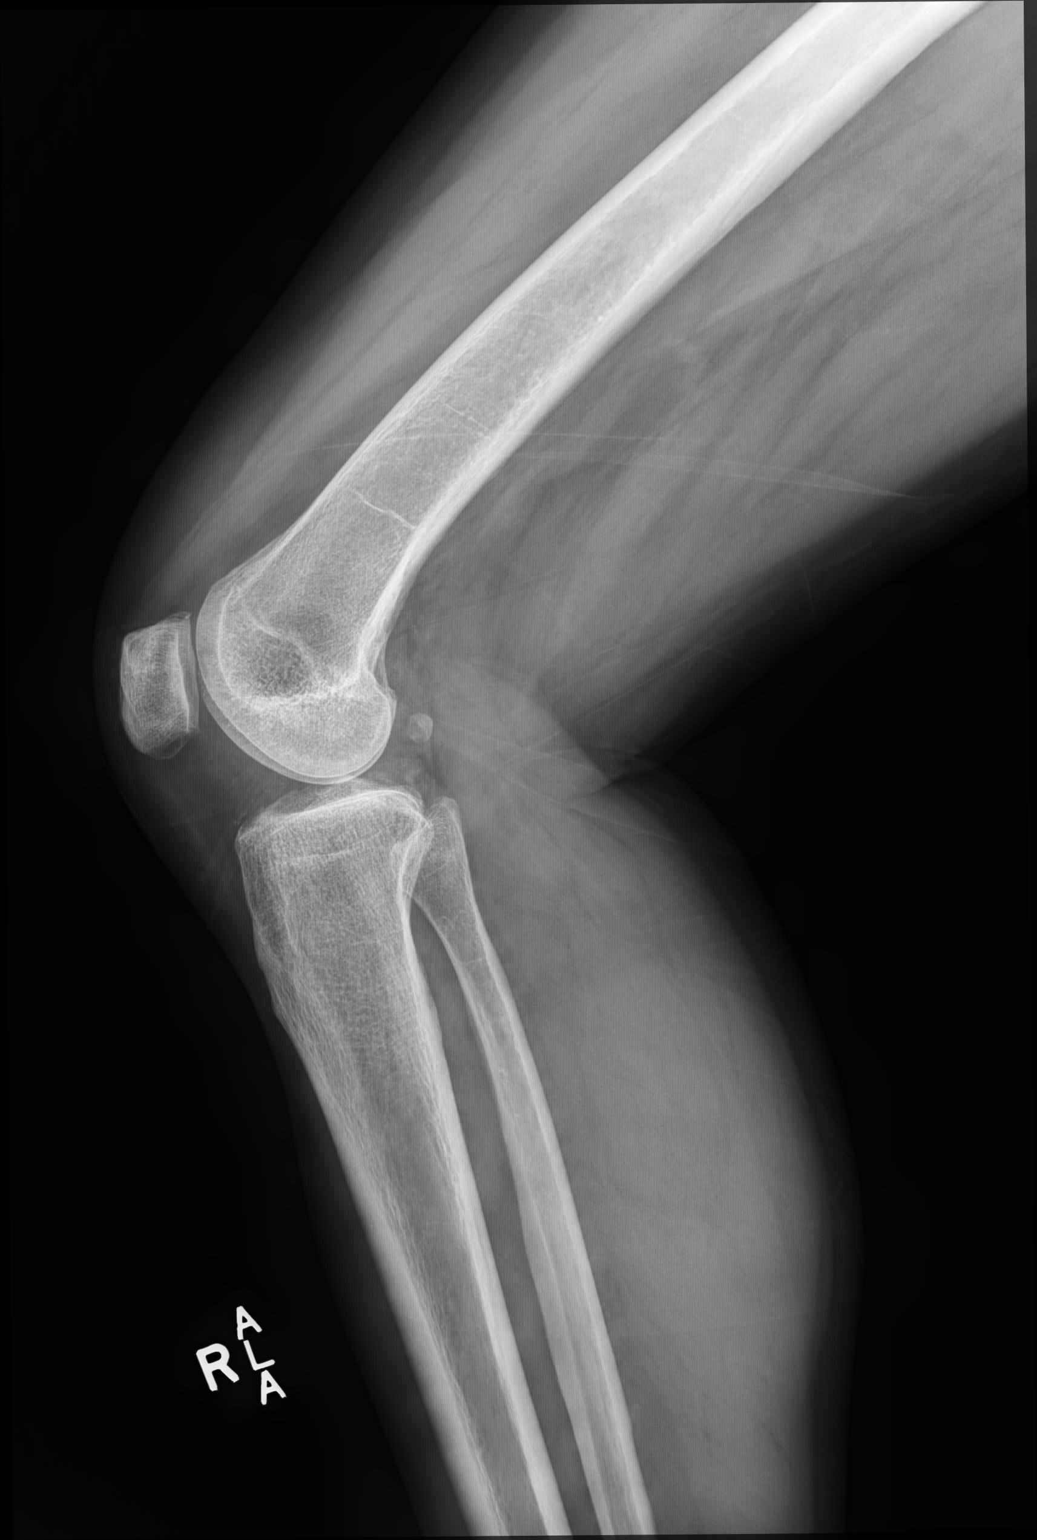

[4 of 4 positions shown; findings below may reference images not displayed]

FINDINGS: No evidence of fracture, dislocation, or joint effusion. There is
mild medial compartment joint space narrowing. There is no
significant osteophyte formation. Soft tissues are within normal
limits. Soft tissues are unremarkable.
IMPRESSION: 1. No acute bony abnormality.
2. Mild degenerative changes of the medial compartment.

## 2023-06-19 ENCOUNTER — Other Ambulatory Visit: Payer: 59

## 2023-06-23 ENCOUNTER — Other Ambulatory Visit (INDEPENDENT_AMBULATORY_CARE_PROVIDER_SITE_OTHER)

## 2023-06-23 DIAGNOSIS — E785 Hyperlipidemia, unspecified: Secondary | ICD-10-CM | POA: Diagnosis not present

## 2023-06-23 DIAGNOSIS — Z79899 Other long term (current) drug therapy: Secondary | ICD-10-CM

## 2023-06-23 DIAGNOSIS — R7301 Impaired fasting glucose: Secondary | ICD-10-CM

## 2023-06-23 LAB — BASIC METABOLIC PANEL
BUN: 16 mg/dL (ref 6–23)
CO2: 28 meq/L (ref 19–32)
Calcium: 9.4 mg/dL (ref 8.4–10.5)
Chloride: 103 meq/L (ref 96–112)
Creatinine, Ser: 0.65 mg/dL (ref 0.40–1.20)
GFR: 97.14 mL/min (ref >60.00)
Glucose, Bld: 114 mg/dL — ABNORMAL HIGH (ref 70–99)
Potassium: 4.6 meq/L (ref 3.5–5.1)
Sodium: 138 meq/L (ref 135–145)

## 2023-06-23 LAB — LIPID PANEL
Cholesterol: 186 mg/dL (ref 0–200)
HDL: 58.9 mg/dL (ref >39.00)
LDL Cholesterol: 97 mg/dL (ref 0–99)
NonHDL: 127.44
Total CHOL/HDL Ratio: 3
Triglycerides: 152 mg/dL — ABNORMAL HIGH (ref 0.0–149.0)
VLDL: 30.4 mg/dL (ref 0.0–40.0)

## 2023-06-23 LAB — HEMOGLOBIN A1C: Hgb A1c MFr Bld: 6.5 % (ref 4.6–6.5)

## 2023-06-25 ENCOUNTER — Encounter: Payer: Self-pay | Admitting: Internal Medicine

## 2023-06-25 NOTE — Progress Notes (Signed)
 Will revewi at upcoming appt  lipA result not back  yet.

## 2023-06-26 ENCOUNTER — Ambulatory Visit: Payer: 59 | Admitting: Internal Medicine

## 2023-06-26 ENCOUNTER — Encounter: Payer: Self-pay | Admitting: Internal Medicine

## 2023-06-26 VITALS — BP 120/84 | HR 97 | Temp 98.4°F | Ht 59.0 in | Wt 121.4 lb

## 2023-06-26 DIAGNOSIS — Z79899 Other long term (current) drug therapy: Secondary | ICD-10-CM

## 2023-06-26 DIAGNOSIS — I10 Essential (primary) hypertension: Secondary | ICD-10-CM | POA: Diagnosis not present

## 2023-06-26 DIAGNOSIS — E785 Hyperlipidemia, unspecified: Secondary | ICD-10-CM | POA: Diagnosis not present

## 2023-06-26 DIAGNOSIS — R7303 Prediabetes: Secondary | ICD-10-CM

## 2023-06-26 DIAGNOSIS — D0512 Intraductal carcinoma in situ of left breast: Secondary | ICD-10-CM

## 2023-06-26 LAB — LIPOPROTEIN A (LPA): Lipoprotein (a): 138 nmol/L — ABNORMAL HIGH (ref <75)

## 2023-06-26 MED ORDER — HYDROCORTISONE (PERIANAL) 2.5 % EX CREA: TOPICAL_CREAM | CUTANEOUS | 1 refills | Status: AC

## 2023-06-26 NOTE — Progress Notes (Signed)
 Chief Complaint  Patient presents with   Medical Management of Chronic Issues    Pt is here for her 3 months f/u. And going over lab result.    Hemorrhoids    Pt request to get a refill on hemorrhoids cream.     HPI: Melissa Brown 58 y.o. come in for Chronic disease management   HT readings much better  HLD taking med   cont lsi  A1c is 6.5  Gyne gave her anusol hc on past as needed for hemorrhoid   only uses ocassionall  but would like refill when flares  stinging swelling irritated  ROS: See pertinent positives and negatives per HPI.  Past Medical History:  Diagnosis Date   Allergic    to grass and moldsby skin testing    Angioedema of lips 12/07/2010   Hx of seem in August 12  remote hx of hives as a child.     Breast cancer (HCC) 2005   left dcis   Breast cancer (HCC)    Diabetes, gestational    GESTATIONAL DIABETES 02/09/2007   Qualifier: History of  By: Fabian Sharp MD, Neta Mends    MVP (mitral valve prolapse)    ANTERIOR LEAFLET, MILD MR   Palpitations    tachy, supressed with atenolol   Scoliosis    Thyroid disease    HYPERTHYROIDISM    Family History  Problem Relation Age of Onset   Diabetes Mother    Thyroid disease Mother    Hypertension Mother    Diabetes Father    Heart disease Father    Diabetes Other     Social History   Socioeconomic History   Marital status: Married    Spouse name: Not on file   Number of children: Not on file   Years of education: Not on file   Highest education level: Not on file  Occupational History   Not on file  Tobacco Use   Smoking status: Never   Smokeless tobacco: Never  Substance and Sexual Activity   Alcohol use: Yes    Alcohol/week: 1.0 standard drink of alcohol    Types: 1 Standard drinks or equivalent per week   Drug use: No   Sexual activity: Not on file  Other Topics Concern   Not on file  Social History Narrative   Born [place] in [place] 25 yes    hhof 2.5 son at college , with dog   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     cetirizine (ZYRTEC) 10 MG tablet Allertec ([employer] Brand) 10mg  daily     Cholecalciferol (VITAMIN D-3 PO) Take by mouth.     clobetasol cream (TEMOVATE) 0.05 % Apply to neck rash BID x 3 weeks, then PRN     CRANBERRY EXTRACT PO Take by mouth daily  at 2 PM.     Estradiol 10 MCG TABS vaginal tablet 1 per vagina HS twice weekly     losartan (COZAAR) 50 MG tablet Take 1 tablet (50 mg total) by mouth daily. 90 tablet 1   Misc Natural Products (OSTEO BI-FLEX/5-LOXIN ADVANCED PO) Take by mouth in the morning and at bedtime.     Multiple Vitamins-Calcium (ONE-A-DAY WOMENS PO) Take by mouth daily.       Omega-3 1000 MG CAPS Take by mouth daily.     rosuvastatin (CRESTOR) 5 MG tablet Take 1 tablet (5 mg total) by mouth daily. 90 tablet 3   hydrocortisone (ANUSOL-HC) 2.5 % rectal cream PRN     No facility-administered medications prior to visit.     EXAM:  BP 120/84 (BP Location: Right Arm, Patient Position: Sitting, Cuff Size: Normal)   Pulse 97   Temp 98.4 F (36.9 C) (Oral)   Ht 4\' 11"  (1.499 m)   Wt 121 lb 6.4 oz (55.1 kg)   SpO2 98%   BMI 24.52 kg/m   Body mass index is 24.52 kg/m.  GENERAL: vitals reviewed and listed above, alert, oriented, appears well hydrated and in no acute distress HEENT: atraumatic, conjunctiva  clear, no obvious abnormalities on inspection  of external nose and ears  NECK: no bruits noted  LUNGS: clear to auscultation bilaterally, no wheezes, rales or rhonchi, good air movement scoliosis  CV: HRRR, no clubbing cyanosis or  peripheral edema nl cap refill  Abd no masses  MS: moves all extremities without noticeable focal  abnormality PSYCH: pleasant and cooperative, no obvious depression or anxiety Lab Results  Component Value Date   WBC 5.4 02/27/2023   HGB 14.1 02/27/2023   HCT 43.0 02/27/2023   PLT 283.0 02/27/2023   GLUCOSE 114 (H) 06/23/2023   CHOL 186 06/23/2023   TRIG 152.0 (H) 06/23/2023   HDL 58.90 06/23/2023   LDLDIRECT 178.0 02/18/2022   LDLCALC 97 06/23/2023   ALT 24 02/27/2023   AST 22 02/27/2023   NA 138 06/23/2023   K 4.6 06/23/2023   CL 103 06/23/2023   CREATININE 0.65 06/23/2023   BUN 16 06/23/2023   CO2 28 06/23/2023   TSH 0.89 02/27/2023   HGBA1C 6.5 06/23/2023   MICROALBUR <0.7 02/27/2023   BP Readings from Last 3 Encounters:  06/26/23 120/84  04/03/23 (!) 151/97  02/27/23 (!) 152/96    ASSESSMENT AND PLAN:  Discussed the following assessment and plan:  Essential hypertension  Hyperlipidemia, unspecified hyperlipidemia type  Medication management  Prediabetes  Ductal carcinoma in situ of left breast Bp at goal ldl much better and tg almost at goal . Continue lifestyle intervention healthy eating and exercise . And meds  Plan 4 mos fu can do A1c in office   Consideration of sglt2  if progression but  doing o for nw  Avoid reg use of rectal  hc cream can use otc if needed   -Patient advised to return or notify health care team  if  new concerns arise.  Patient Instructions  Continue lifestyle intervention healthy eating and exercise .   Neta Mends. Elise Gladden M.D.

## 2023-06-26 NOTE — Patient Instructions (Signed)
Continue lifestyle intervention healthy eating and exercise .  

## 2023-06-27 NOTE — Progress Notes (Signed)
 Lipo a is elevated ;another Cv risk factor but not usually change with statin but all the more reason to continue the statin medication as we planned

## 2023-09-19 ENCOUNTER — Encounter: Payer: Self-pay | Admitting: Internal Medicine

## 2023-09-19 MED ORDER — LOSARTAN POTASSIUM 50 MG PO TABS: 50.0000 mg | ORAL_TABLET | Freq: Every day | ORAL | 1 refills | Status: DC

## 2023-11-06 ENCOUNTER — Ambulatory Visit: Admitting: Internal Medicine

## 2023-11-06 ENCOUNTER — Other Ambulatory Visit: Payer: Self-pay | Admitting: Internal Medicine

## 2023-11-06 ENCOUNTER — Encounter: Payer: Self-pay | Admitting: Internal Medicine

## 2023-11-06 VITALS — BP 136/82 | HR 2 | Temp 97.9°F | Ht 59.0 in | Wt 125.6 lb

## 2023-11-06 DIAGNOSIS — E785 Hyperlipidemia, unspecified: Secondary | ICD-10-CM

## 2023-11-06 DIAGNOSIS — R7303 Prediabetes: Secondary | ICD-10-CM

## 2023-11-06 DIAGNOSIS — Z79899 Other long term (current) drug therapy: Secondary | ICD-10-CM | POA: Diagnosis not present

## 2023-11-06 DIAGNOSIS — I10 Essential (primary) hypertension: Secondary | ICD-10-CM

## 2023-11-06 DIAGNOSIS — L989 Disorder of the skin and subcutaneous tissue, unspecified: Secondary | ICD-10-CM

## 2023-11-06 DIAGNOSIS — Z0000 Encounter for general adult medical examination without abnormal findings: Secondary | ICD-10-CM

## 2023-11-06 LAB — POCT GLYCOSYLATED HEMOGLOBIN (HGB A1C): Hemoglobin A1C: 6.4 % — AB (ref 4.0–5.6)

## 2023-11-06 NOTE — Progress Notes (Signed)
 Chief Complaint  Patient presents with   Medical Management of Chronic Issues   Cyst    Pt c/o blister on R third toe. Noticed it 4-5 wks ago. Denied pain with touch. Did not used any ointment.     HPI: Melissa Brown 58 y.o. come in for Chronic disease management  and fu  bg A1c  HT: Losartan    HLD:Rosuvastatin   No se  working on lsi  feel ok no new sx    New problem X 5  weeks + of blister appearing  lesion on r toe  tender at onset and now no pain but enlarging  denies  trauma or  friction hx  discharge  weeping .   ROS: See pertinent positives and negatives per HPI.  Past Medical History:  Diagnosis Date   Allergic    to grass and moldsby skin testing    Angioedema of lips 12/07/2010   Hx of seem in August 12  remote hx of hives as a child.     Breast cancer (HCC) 2005   left dcis   Breast cancer (HCC)    Diabetes, gestational    GESTATIONAL DIABETES 02/09/2007   Qualifier: History of  By: Charlett MD, Apolinar POUR    MVP (mitral valve prolapse)    ANTERIOR LEAFLET, MILD MR   Palpitations    tachy, supressed with atenolol   Scoliosis    Thyroid  disease    HYPERTHYROIDISM    Family History  Problem Relation Age of Onset   Diabetes Mother    Thyroid  disease Mother    Hypertension Mother    Diabetes Father    Heart disease Father    Diabetes Other     Social History   Socioeconomic History   Marital status: Married    Spouse name: Not on file   Number of children: Not on file   Years of education: Not on file   Highest education level: Not on file  Occupational History   Not on file  Tobacco Use   Smoking status: Never   Smokeless tobacco: Never  Substance and Sexual Activity   Alcohol use: Yes    Alcohol/week: 1.0 standard drink of alcohol    Types: 1 Standard drinks or equivalent per week   Drug use: No   Sexual activity: Not on file  Other Topics Concern   Not on file  Social History Narrative   Born [place] in [place]  25 yes    hhof 2.5 son at college ,  with dog   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 (Patient taking differently: Apply topically as needed.)     cetirizine (ZYRTEC) 10 MG tablet Allertec ([employer] Brand) 10mg  daily     Cholecalciferol (VITAMIN D -3 PO) Take by mouth.     clobetasol cream (TEMOVATE)  0.05 % Apply to neck rash BID x 3 weeks, then PRN     CRANBERRY EXTRACT PO Take by mouth daily at 2 PM.     hydrocortisone  (ANUSOL -HC) 2.5 % rectal cream PRNPRN 30 g 1   losartan  (COZAAR ) 50 MG tablet Take 1 tablet (50 mg total) by mouth daily. 90 tablet 1   Misc Natural Products (OSTEO BI-FLEX/5-LOXIN ADVANCED PO) Take by mouth in the morning and at bedtime.     Multiple Vitamins-Calcium  (ONE-A-DAY WOMENS PO) Take by mouth daily.       Omega-3 1000 MG CAPS Take by mouth daily.     rosuvastatin  (CRESTOR ) 5 MG tablet Take 1 tablet (5 mg total) by mouth daily. 90 tablet 3   Estradiol  10 MCG TABS vaginal tablet 1 per vagina HS twice weekly (Patient not taking: Reported on 11/06/2023)     No facility-administered medications prior to visit.     EXAM:  BP 136/82 (BP Location: Left Arm, Patient Position: Sitting, Cuff Size: Normal)   Pulse (!) 2   Temp 97.9 F (36.6 C) (Oral)   Ht 4' 11 (1.499 m)   Wt 125 lb 9.6 oz (57 kg)   SpO2 98%   BMI 25.37 kg/m   Body mass index is 25.37 kg/m.  GENERAL: vitals reviewed and listed  above, alert, oriented, appears well hydrated and in no acute distress HEENT: atraumatic, conjunctiva  clear, no obvious abnormalities on inspection of external nose and ears  CV: HRRR, no clubbing cyanosis or  peripheral edema nl cap refill  MS: moves all extremities r middle toe dorsal aspect with cystic blister like lesion with scaly top fairly firm  mild pink base  non tender  rest of foot nl  PSYCH: pleasant and cooperative, no obvious depression or anxiety        Lab Results  Component Value Date   WBC 5.4 02/27/2023   HGB 14.1 02/27/2023   HCT 43.0 02/27/2023   PLT 283.0 02/27/2023   GLUCOSE 114 (H) 06/23/2023   CHOL 186 06/23/2023   TRIG 152.0 (H) 06/23/2023   HDL 58.90 06/23/2023   LDLDIRECT 178.0 02/18/2022   LDLCALC 97 06/23/2023   ALT 24 02/27/2023   AST 22 02/27/2023   NA 138 06/23/2023   K 4.6 06/23/2023   CL 103 06/23/2023   CREATININE 0.65 06/23/2023   BUN 16 06/23/2023   CO2 28 06/23/2023   TSH 0.89 02/27/2023   HGBA1C 6.4 (A) 11/06/2023   BP Readings from Last 3 Encounters:  11/06/23 136/82  06/26/23 120/84  04/03/23 (!) 151/97    ASSESSMENT AND PLAN:  Discussed the following assessment and plan:  Essential hypertension  Hyperlipidemia, unspecified hyperlipidemia type  Medication management  Prediabetes diabetes controlled - Plan: POC HgB A1c  Skin lesion of foot toe - blister like but firmer like a cyst and scaly top,perists and enlarging  over a month and no trauma ;plan derm  referral consult  Due  cpe  11 21 25    plan labs pre visit  in interim   Has seen Dr Jerrell [facility] in past  for other problems  will order referral to his team but  -Patient advised to return or notify health care team  if  new concerns arise.  Patient Instructions  Good to  see you today.     A1c is 6.4  Continue medications.   Plan cpe and labs pre visit in November.  Will do referral derm for lesion on toe . Not  sure  what dx is .   You can also call  dr Henretta team as they may give you appt by calling.    Thiago Ragsdale K. Akisha Sturgill M.D.

## 2023-11-06 NOTE — Patient Instructions (Addendum)
 Good to  see you today.     A1c is 6.4  Continue medications.   Plan cpe and labs pre visit in November.  Will do referral derm for lesion on toe . Not sure  what dx is .   You can also call dr Henretta team as they may give you appt by calling.

## 2024-03-05 ENCOUNTER — Other Ambulatory Visit

## 2024-03-09 ENCOUNTER — Other Ambulatory Visit (INDEPENDENT_AMBULATORY_CARE_PROVIDER_SITE_OTHER)

## 2024-03-09 DIAGNOSIS — Z0000 Encounter for general adult medical examination without abnormal findings: Secondary | ICD-10-CM

## 2024-03-09 DIAGNOSIS — E785 Hyperlipidemia, unspecified: Secondary | ICD-10-CM

## 2024-03-09 DIAGNOSIS — R7303 Prediabetes: Secondary | ICD-10-CM

## 2024-03-09 DIAGNOSIS — Z79899 Other long term (current) drug therapy: Secondary | ICD-10-CM | POA: Diagnosis not present

## 2024-03-09 DIAGNOSIS — I10 Essential (primary) hypertension: Secondary | ICD-10-CM | POA: Diagnosis not present

## 2024-03-09 LAB — CBC WITH DIFFERENTIAL/PLATELET
Basophils Absolute: 0 K/uL (ref 0.0–0.1)
Basophils Relative: 0.5 % (ref 0.0–3.0)
Eosinophils Absolute: 0.3 K/uL (ref 0.0–0.7)
Eosinophils Relative: 6.3 % — ABNORMAL HIGH (ref 0.0–5.0)
HCT: 38.4 % (ref 36.0–46.0)
Hemoglobin: 12.8 g/dL (ref 12.0–15.0)
Lymphocytes Relative: 28.3 % (ref 12.0–46.0)
Lymphs Abs: 1.5 K/uL (ref 0.7–4.0)
MCHC: 33.4 g/dL (ref 30.0–36.0)
MCV: 92.6 fl (ref 78.0–100.0)
Monocytes Absolute: 0.4 K/uL (ref 0.1–1.0)
Monocytes Relative: 7.7 % (ref 3.0–12.0)
Neutro Abs: 3.1 K/uL (ref 1.4–7.7)
Neutrophils Relative %: 57.2 % (ref 43.0–77.0)
Platelets: 270 K/uL (ref 150.0–400.0)
RBC: 4.15 Mil/uL (ref 3.87–5.11)
RDW: 12.8 % (ref 11.5–15.5)
WBC: 5.4 K/uL (ref 4.0–10.5)

## 2024-03-09 LAB — BASIC METABOLIC PANEL WITH GFR
BUN: 16 mg/dL (ref 6–23)
CO2: 29 meq/L (ref 19–32)
Calcium: 9.6 mg/dL (ref 8.4–10.5)
Chloride: 100 meq/L (ref 96–112)
Creatinine, Ser: 0.65 mg/dL (ref 0.40–1.20)
GFR: 96.65 mL/min (ref >60.00)
Glucose, Bld: 97 mg/dL (ref 70–99)
Potassium: 4.8 meq/L (ref 3.5–5.1)
Sodium: 136 meq/L (ref 135–145)

## 2024-03-09 LAB — MICROALBUMIN / CREATININE URINE RATIO
Creatinine,U: 26.8 mg/dL
Microalb Creat Ratio: UNDETERMINED mg/g (ref 0.0–30.0)
Microalb, Ur: <0.7 mg/dL

## 2024-03-09 LAB — HEMOGLOBIN A1C: Hgb A1c MFr Bld: 6.4 % (ref 4.6–6.5)

## 2024-03-09 LAB — LIPID PANEL
Cholesterol: 191 mg/dL (ref 0–200)
HDL: 50.5 mg/dL (ref >39.00)
LDL Cholesterol: 98 mg/dL (ref 0–99)
NonHDL: 140.71
Total CHOL/HDL Ratio: 4
Triglycerides: 215 mg/dL — ABNORMAL HIGH (ref 0.0–149.0)
VLDL: 43 mg/dL — ABNORMAL HIGH (ref 0.0–40.0)

## 2024-03-09 LAB — HEPATIC FUNCTION PANEL
ALT: 30 U/L (ref 0–35)
AST: 22 U/L (ref 0–37)
Albumin: 4.5 g/dL (ref 3.5–5.2)
Alkaline Phosphatase: 71 U/L (ref 39–117)
Bilirubin, Direct: 0.1 mg/dL (ref 0.0–0.3)
Total Bilirubin: 0.5 mg/dL (ref 0.2–1.2)
Total Protein: 7.5 g/dL (ref 6.0–8.3)

## 2024-03-09 LAB — TSH: TSH: 1.71 u[IU]/mL (ref 0.35–5.50)

## 2024-03-10 NOTE — Progress Notes (Unsigned)
 No chief complaint on file.   HPI: Patient  Melissa Brown  58 y.o. comes in today for Preventive Health Care visit   Health Maintenance  Topic Date Due   FOOT EXAM  Never done   HIV Screening  Never done   Pneumococcal Vaccine: 50+ Years (1 of 2 - PCV) Never done   Zoster Vaccines- Shingrix (2 of 2) 01/10/2018   OPHTHALMOLOGY EXAM  07/20/2018   COVID-19 Vaccine (4 - 2025-26 season) 12/08/2023   HEMOGLOBIN A1C  09/07/2024   Diabetic kidney evaluation - eGFR measurement  03/09/2025   Diabetic kidney evaluation - Urine ACR  03/09/2025   Mammogram  03/09/2025   Colonoscopy  09/24/2027   DTaP/Tdap/Td (3 - Td or Tdap) 07/19/2029   Influenza Vaccine  Completed   Hepatitis B Vaccines 19-59 Average Risk  Completed   Hepatitis C Screening  Completed   HPV VACCINES  Aged Out   Meningococcal B Vaccine  Aged Out   Health Maintenance Review LIFESTYLE:  Exercise:   Tobacco/ETS: Alcohol:  Sugar beverages: Sleep: Drug use: no HH of  Work:    ROS:  GEN/ HEENT: No fever, significant weight changes sweats headaches vision problems hearing changes, CV/ PULM; No chest pain shortness of breath cough, syncope,edema  change in exercise tolerance. GI /GU: No adominal pain, vomiting, change in bowel habits. No blood in the stool. No significant GU symptoms. SKIN/HEME: ,no acute skin rashes suspicious lesions or bleeding. No lymphadenopathy, nodules, masses.  NEURO/ PSYCH:  No neurologic signs such as weakness numbness. No depression anxiety. IMM/ Allergy: No unusual infections.  Allergy .   REST of 12 system review negative except as per HPI   Past Medical History:  Diagnosis Date   Allergic    to grass and moldsby skin testing    Angioedema of lips 12/07/2010   Hx of seem in August 12  remote hx of hives as a child.     Breast cancer (HCC) 2005   left dcis   Breast cancer (HCC)    Diabetes, gestational    GESTATIONAL DIABETES 02/09/2007   Qualifier: History of  By: Charlett MD, Apolinar POUR    MVP (mitral valve prolapse)    ANTERIOR LEAFLET, MILD MR   Palpitations    tachy, supressed with atenolol   Scoliosis    Thyroid  disease    HYPERTHYROIDISM    Past Surgical History:  Procedure Laterality Date   APPENDECTOMY     BREAST LUMPECTOMY     radiation DCIS- reconstruction left   CESAREAN SECTION     TOTAL ABDOMINAL HYSTERECTOMY W/ BILATERAL SALPINGOOPHORECTOMY      Family History  Problem Relation Age of Onset   Diabetes Mother    Thyroid  disease Mother    Hypertension Mother    Diabetes Father    Heart disease Father    Diabetes Other     Social History   Socioeconomic History   Marital status: Married    Spouse name: Not on file   Number of children: Not on file   Years of education: Not on file   Highest education level: Not on file  Occupational History   Not on file  Tobacco Use   Smoking status: Never   Smokeless tobacco: Never  Substance and Sexual Activity   Alcohol use: Yes    Alcohol/week: 1.0 standard drink of alcohol    Types: 1 Standard drinks or equivalent per week   Drug use: No   Sexual activity: Not  on file  Other Topics Concern   Not on file  Social History Narrative   Born [place] in [place]  25 yes    hhof 2.5 son at college , with dog   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 (Patient taking differently: Apply topically as needed.)      cetirizine (ZYRTEC) 10 MG tablet Allertec ([employer] Brand) 10mg  daily     Cholecalciferol (VITAMIN D -3 PO) Take by mouth.     clobetasol cream (TEMOVATE) 0.05 % Apply to neck rash BID x 3 weeks, then PRN     CRANBERRY EXTRACT PO Take by mouth daily at 2 PM.     Estradiol  10 MCG TABS vaginal tablet 1 per vagina HS twice weekly (Patient not taking: Reported on 11/06/2023)     hydrocortisone  (ANUSOL -HC) 2.5 % rectal cream PRNPRN 30 g 1   losartan  (COZAAR ) 50 MG tablet Take 1 tablet (50 mg total) by mouth daily. 90 tablet 1   Misc Natural Products (OSTEO BI-FLEX/5-LOXIN ADVANCED PO) Take by mouth in the morning and at bedtime.     Multiple Vitamins-Calcium  (ONE-A-DAY WOMENS PO) Take by mouth daily.       Omega-3 1000 MG CAPS Take by mouth daily.     rosuvastatin  (CRESTOR ) 5 MG tablet Take 1 tablet (5 mg total) by mouth daily. 90 tablet 3   No facility-administered medications prior to visit.     EXAM:  There were no vitals taken for this visit.  There is no height or weight on file to calculate BMI. Wt Readings from Last 3 Encounters:  11/06/23 125 lb 9.6 oz (57 kg)  06/26/23 121 lb 6.4 oz (55.1 kg)  04/03/23 126 lb 6.4 oz (57.3 kg)    Physical Exam: Vital signs reviewed HZW:Uypd is a well-developed well-nourished alert cooperative    who appearsr stated age in no acute distress.  HEENT: normocephalic atraumatic , Eyes: PERRL EOM's full, conjunctiva clear, Nares: paten,t no deformity discharge or tenderness., Ears: no deformity EAC's clear TMs with normal landmarks. Mouth: clear OP, no lesions, edema.  Moist mucous membranes. Dentition in adequate repair. NECK: supple without masses, thyromegaly or bruits. CHEST/PULM:  Clear to auscultation and percussion breath sounds equal no wheeze , rales or rhonchi. No chest wall deformities or tenderness. Breast: normal by inspection . No dimpling, discharge, masses, tenderness or discharge . CV: PMI is nondisplaced, S1 S2 no gallops, murmurs,  rubs. Peripheral pulses are full without delay.No JVD .  ABDOMEN: Bowel sounds normal nontender  No guard or rebound, no hepato splenomegal no CVA tenderness.  No hernia. Extremtities:  No clubbing cyanosis or edema, no acute joint swelling or redness no focal atrophy NEURO:  Oriented x3, cranial nerves 3-12 appear to be intact, no obvious focal weakness,gait within normal limits no abnormal reflexes or asymmetrical SKIN:  No acute rashes normal turgor, color, no bruising or petechiae. PSYCH: Oriented, good eye contact, no obvious depression anxiety, cognition and judgment appear normal. LN: no cervical axillary inguinal adenopathy  Lab Results  Component Value Date   WBC 5.4 03/09/2024   HGB 12.8 03/09/2024   HCT 38.4 03/09/2024   PLT 270.0 03/09/2024   GLUCOSE 97 03/09/2024   CHOL 191 03/09/2024   TRIG 215.0 (H) 03/09/2024   HDL 50.50 03/09/2024   LDLDIRECT 178.0 02/18/2022   LDLCALC 98 03/09/2024   ALT 30 03/09/2024   AST 22 03/09/2024   NA 136 03/09/2024   K 4.8 03/09/2024   CL 100 03/09/2024   CREATININE 0.65 03/09/2024   BUN 16 03/09/2024   CO2 29 03/09/2024   TSH 1.71 03/09/2024   HGBA1C 6.4 03/09/2024   MICROALBUR <0.7 03/09/2024    BP Readings from Last 3 Encounters:  11/06/23 136/82  06/26/23 120/84  04/03/23 (!) 151/97    Lab results reviewed with patient   ASSESSMENT AND PLAN:  Discussed the following assessment and plan:    ICD-10-CM   1. Routine general medical examination at a health care facility  Z00.00     2. Essential hypertension  I10     3. Hyperlipidemia, unspecified hyperlipidemia type  E78.5     4. Medication management  Z79.899     5. Prediabetes  R73.03      No follow-ups on file.  Patient Care Team: Makahla Kiser, Apolinar POUR, MD as PCP - Diedre Betsey Raring, MD as Referring Physician (Obstetrics and Gynecology) arminda adrien Siri Devere, MD as Consulting Physician (Internal Medicine) There are no Patient Instructions on file for this  visit.  Ugonna Keirsey K. Nohely Whitehorn M.D.

## 2024-03-11 ENCOUNTER — Ambulatory Visit: Admitting: Internal Medicine

## 2024-03-11 ENCOUNTER — Encounter: Payer: Self-pay | Admitting: Internal Medicine

## 2024-03-11 VITALS — BP 130/78 | HR 69 | Temp 97.9°F | Ht 59.0 in | Wt 124.6 lb

## 2024-03-11 DIAGNOSIS — R7303 Prediabetes: Secondary | ICD-10-CM | POA: Diagnosis not present

## 2024-03-11 DIAGNOSIS — Z0000 Encounter for general adult medical examination without abnormal findings: Secondary | ICD-10-CM

## 2024-03-11 DIAGNOSIS — I10 Essential (primary) hypertension: Secondary | ICD-10-CM | POA: Diagnosis not present

## 2024-03-11 DIAGNOSIS — R0982 Postnasal drip: Secondary | ICD-10-CM

## 2024-03-11 DIAGNOSIS — E785 Hyperlipidemia, unspecified: Secondary | ICD-10-CM | POA: Diagnosis not present

## 2024-03-11 DIAGNOSIS — Z79899 Other long term (current) drug therapy: Secondary | ICD-10-CM | POA: Diagnosis not present

## 2024-03-11 MED ORDER — ROSUVASTATIN CALCIUM 10 MG PO TABS: 10.0000 mg | ORAL_TABLET | Freq: Every day | ORAL | 3 refills | Status: AC

## 2024-03-11 MED ORDER — AZELASTINE HCL 0.1 % NA SOLN: 2.0000 | Freq: Two times a day (BID) | NASAL | 1 refills | Status: AC

## 2024-03-11 MED ORDER — AMOXICILLIN 500 MG PO CAPS: 500.0000 mg | ORAL_CAPSULE | Freq: Three times a day (TID) | ORAL | 0 refills | Status: AC

## 2024-03-11 MED ORDER — LOSARTAN POTASSIUM 50 MG PO TABS: 50.0000 mg | ORAL_TABLET | Freq: Every day | ORAL | 3 refills | Status: AC

## 2024-03-11 NOTE — Patient Instructions (Addendum)
 Good to see  you today   Try to add antihistamine nose spray   for drainage ..usually helps allergy and other types of reactive drainage.   If not improving  we can treat as  sinus infection  if  persistent or progressive  Amoxicillin add on  Increase dose of rosuvastatin  to get lel down to 70 range .   Can take 10 mg per day  or 5 mg /d 4 d per week. 5 mg .  Recheck lipid panel in 4 mos   and go from there

## 2024-03-30 ENCOUNTER — Ambulatory Visit: Admitting: Internal Medicine

## 2024-03-30 ENCOUNTER — Encounter: Payer: Self-pay | Admitting: Internal Medicine

## 2024-03-30 VITALS — BP 124/72 | HR 98 | Temp 98.2°F | Ht 59.0 in | Wt 123.8 lb

## 2024-03-30 DIAGNOSIS — J329 Chronic sinusitis, unspecified: Secondary | ICD-10-CM | POA: Diagnosis not present

## 2024-03-30 DIAGNOSIS — Z801 Family history of malignant neoplasm of trachea, bronchus and lung: Secondary | ICD-10-CM | POA: Diagnosis not present

## 2024-03-30 DIAGNOSIS — J40 Bronchitis, not specified as acute or chronic: Secondary | ICD-10-CM | POA: Diagnosis not present

## 2024-03-30 MED ORDER — ALBUTEROL SULFATE HFA 108 (90 BASE) MCG/ACT IN AERS: 2.0000 | INHALATION_SPRAY | Freq: Four times a day (QID) | RESPIRATORY_TRACT | 0 refills | Status: AC | PRN

## 2024-03-30 MED ORDER — DOXYCYCLINE HYCLATE 100 MG PO TABS: 100.0000 mg | ORAL_TABLET | Freq: Two times a day (BID) | ORAL | 0 refills | Status: AC

## 2024-03-30 MED ORDER — PREDNISONE 20 MG PO TABS: 40.0000 mg | ORAL_TABLET | Freq: Every day | ORAL | 0 refills | Status: AC

## 2024-03-30 NOTE — Patient Instructions (Signed)
 Treat for  forms of bronchitis  . Different antibiotic  prednisone  3-5 days   and can add inhaler  for bronchodilatation as needed for wheezing .   If   persistent or progressive   and not getting better after  a week or this let us  know we can consider getting chest x ray . To r/o surprises.

## 2024-03-30 NOTE — Progress Notes (Signed)
 "  Chief Complaint  Patient presents with   Cough    Pt c/o ongoing productive cough- clear. Nonstop. Couldn't sleep at night. Pt reports she has this problem every time. This seem to be worse. Taking antihistamine, nasal spray from Dr. Charlett. Didn't work. Search up and found that acid reflux could cause the drainage and took nexium. Did not work.     HPI: Melissa Brown 58 y.o. come in for  ongoing sx  see last notes  Cough worse  lots of pnd mucous  and wheezing feeing  No fever  sinus congestion continues  antibiotic and antihistamine  not helping  Similar sx in fall but this is persistent    ROS: See pertinent positives and negatives per HPI.  Past Medical History:  Diagnosis Date   Allergic    to grass and moldsby skin testing    Angioedema of lips 12/07/2010   Hx of seem in August 12  remote hx of hives as a child.     Breast cancer (HCC) 2005   left dcis   Breast cancer (HCC)    Diabetes, gestational    GESTATIONAL DIABETES 02/09/2007   Qualifier: History of  By: Charlett MD, Apolinar POUR    MVP (mitral valve prolapse)    ANTERIOR LEAFLET, MILD MR   Palpitations    tachy, supressed with atenolol   Scoliosis    Thyroid  disease    HYPERTHYROIDISM    Family History  Problem Relation Age of Onset   Diabetes Mother    Thyroid  disease Mother    Hypertension Mother    Diabetes Father    Heart disease Father    Diabetes Other     Social History   Socioeconomic History   Marital status: Married    Spouse name: Not on file   Number of children: Not on file   Years of education: Not on file   Highest education level: Not on file  Occupational History   Not on file  Tobacco Use   Smoking status: Never   Smokeless tobacco: Never  Substance and Sexual Activity   Alcohol use: Yes    Alcohol/week: 1.0 standard drink of alcohol    Types: 1 Standard drinks or equivalent per week   Drug use: No   Sexual activity: Not on file  Other Topics Concern   Not on file  Social  History Narrative   Born [place] in [place]  25 yes    hhof 2.5 son at college , with dog   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   Married   Never Smoked   Alcohol use-no   Sleep 7 hours to 9   To do online degree accounting    Social Drivers of Health   Tobacco Use: Low Risk (03/30/2024)   Patient History    Smoking Tobacco Use: Never    Smokeless Tobacco Use: Never    Passive Exposure: Not on file  Financial Resource Strain: Not on file  Food Insecurity: Not on file  Transportation Needs: Not on file  Physical Activity: Insufficiently Active (02/27/2023)   Exercise Vital Sign    Days of Exercise per Week: 3 days    Minutes of Exercise per Session: 30 min  Stress: Not on file  Social Connections: Not on file  Depression (PHQ2-9): Low Risk (03/11/2024)   Depression (PHQ2-9)    PHQ-2 Score: 0  Alcohol Screen: Not on file  Housing: Not on file  Utilities: Not on file  Health  Literacy: Not on file    Outpatient Medications Prior to Visit  Medication Sig Dispense Refill   augmented betamethasone  dipropionate (DIPROLENE -AF) 0.05 % cream Apply topically. (Patient taking differently: Apply topically as needed.)     azelastine  (ASTELIN ) 0.1 % nasal spray Place 2 sprays into both nostrils 2 (two) times daily. Use in each nostril as directed for nasal drainage 30 mL 1   cetirizine (ZYRTEC) 10 MG tablet Allertec ([employer] Brand) 10mg  daily     Cholecalciferol (VITAMIN D -3 PO) Take by mouth.     clobetasol cream (TEMOVATE) 0.05 % Apply to neck rash BID x 3 weeks, then PRN     CRANBERRY EXTRACT PO Take by mouth daily at 2 PM.     hydrocortisone  (ANUSOL -HC) 2.5 % rectal cream PRNPRN 30 g 1   losartan  (COZAAR ) 50 MG tablet Take 1 tablet (50 mg total) by mouth daily. 90 tablet 3   Misc Natural Products (OSTEO BI-FLEX/5-LOXIN ADVANCED PO) Take by mouth in the morning and at bedtime.     Multiple Vitamins-Calcium  (ONE-A-DAY WOMENS PO) Take by mouth daily.       Omega-3 1000 MG CAPS Take by mouth  daily.     rosuvastatin  (CRESTOR ) 10 MG tablet Take 1 tablet (10 mg total) by mouth daily. Dosage adjustment 90 tablet 3   Estradiol  10 MCG TABS vaginal tablet 1 per vagina HS twice weekly (Patient not taking: Reported on 03/30/2024)     No facility-administered medications prior to visit.     EXAM:  BP 124/72 (BP Location: Left Arm, Patient Position: Sitting, Cuff Size: Normal)   Pulse 98   Temp 98.2 F (36.8 C) (Oral)   Ht 4' 11 (1.499 m)   Wt 123 lb 12.8 oz (56.2 kg)   SpO2 97%   BMI 25.00 kg/m   Body mass index is 25 kg/m.  GENERAL: vitals reviewed and listed above, alert, oriented, appears well hydrated and in no acute distress very congested upper  and ocass cough  HEENT: atraumatic, conjunctiva  clear, no obvious abnormalities on inspection of external nose and ears tm nad nares cong  no edema  OP : no lesion edema or exudate  NECK: no obvious masses on inspection palpation  LUNGS:  ocass scattered musical wheezing  no rales   good air movement CV: HRRR, no clubbing cyanosis or  peripheral edema nl cap refill  MS: moves all extremities without noticeable focal  abnormality PSYCH: pleasant and cooperative, no obvious depression or anxiety Lab Results  Component Value Date   WBC 5.4 03/09/2024   HGB 12.8 03/09/2024   HCT 38.4 03/09/2024   PLT 270.0 03/09/2024   GLUCOSE 97 03/09/2024   CHOL 191 03/09/2024   TRIG 215.0 (H) 03/09/2024   HDL 50.50 03/09/2024   LDLDIRECT 178.0 02/18/2022   LDLCALC 98 03/09/2024   ALT 30 03/09/2024   AST 22 03/09/2024   NA 136 03/09/2024   K 4.8 03/09/2024   CL 100 03/09/2024   CREATININE 0.65 03/09/2024   BUN 16 03/09/2024   CO2 29 03/09/2024   TSH 1.71 03/09/2024   HGBA1C 6.4 03/09/2024   MICROALBUR <0.7 03/09/2024   BP Readings from Last 3 Encounters:  03/30/24 124/72  03/11/24 130/78  11/06/23 136/82    ASSESSMENT AND PLAN:  Discussed the following assessment and plan:  Wheezy bronchitis  Sinusitis, unspecified  chronicity, unspecified location  Family history of lung cancer - father Prolonged resp illness  Still suspect  underlying  allergy I or reactive  and then infection  Add pred doxy  and albuterol  as needed  Father passed from lung cancer and though she didn't smoke may have had sig ets work and home?  No CLD but if on going sx then consider chest ct scan in fu as opposed to c xray  -Patient advised to return or notify health care team  if  new concerns arise.  Patient Instructions  Treat for  forms of bronchitis  . Different antibiotic  prednisone  3-5 days   and can add inhaler  for bronchodilatation as needed for wheezing .   If   persistent or progressive   and not getting better after  a week or this let us  know we can consider getting chest x ray . To r/o surprises.     Kaye Mitro K. Kasey Hansell M.D. "

## 2024-03-31 ENCOUNTER — Ambulatory Visit: Admitting: Internal Medicine

## 2024-07-09 ENCOUNTER — Other Ambulatory Visit
